# Patient Record
Sex: Female | Born: 1970 | Race: Black or African American | Hispanic: No | Marital: Married | Smoking: Never smoker
Health system: Southern US, Community
[De-identification: ages and names within clinical notes are randomized; demographics above are authoritative.]

## PROBLEM LIST (undated history)

## (undated) DIAGNOSIS — E669 Obesity, unspecified: Secondary | ICD-10-CM

## (undated) DIAGNOSIS — E785 Hyperlipidemia, unspecified: Secondary | ICD-10-CM

## (undated) HISTORY — DX: Hyperlipidemia, unspecified: E78.5

## (undated) HISTORY — PX: NO PAST SURGERIES: SHX2092

## (undated) HISTORY — DX: Obesity, unspecified: E66.9

---

## 1999-08-19 ENCOUNTER — Other Ambulatory Visit: Admission: RE | Admit: 1999-08-19 | Discharge: 1999-08-19 | Payer: Self-pay | Admitting: *Deleted

## 2000-02-29 ENCOUNTER — Other Ambulatory Visit: Admission: RE | Admit: 2000-02-29 | Discharge: 2000-02-29 | Payer: Self-pay | Admitting: Obstetrics and Gynecology

## 2000-05-09 ENCOUNTER — Ambulatory Visit (HOSPITAL_COMMUNITY): Admission: RE | Admit: 2000-05-09 | Discharge: 2000-05-09 | Payer: Self-pay | Admitting: *Deleted

## 2000-05-09 ENCOUNTER — Encounter: Payer: Self-pay | Admitting: *Deleted

## 2000-07-31 ENCOUNTER — Inpatient Hospital Stay (HOSPITAL_COMMUNITY): Admission: AD | Admit: 2000-07-31 | Discharge: 2000-07-31 | Payer: Self-pay | Admitting: Obstetrics and Gynecology

## 2000-10-02 ENCOUNTER — Encounter (INDEPENDENT_AMBULATORY_CARE_PROVIDER_SITE_OTHER): Payer: Self-pay | Admitting: Specialist

## 2000-10-02 ENCOUNTER — Inpatient Hospital Stay (HOSPITAL_COMMUNITY): Admission: AD | Admit: 2000-10-02 | Discharge: 2000-10-05 | Payer: Self-pay | Admitting: Obstetrics and Gynecology

## 2001-01-30 ENCOUNTER — Other Ambulatory Visit: Admission: RE | Admit: 2001-01-30 | Discharge: 2001-01-30 | Payer: Self-pay | Admitting: Obstetrics and Gynecology

## 2001-04-10 ENCOUNTER — Encounter: Payer: Self-pay | Admitting: Obstetrics and Gynecology

## 2001-04-10 ENCOUNTER — Ambulatory Visit (HOSPITAL_COMMUNITY): Admission: RE | Admit: 2001-04-10 | Discharge: 2001-04-10 | Payer: Self-pay | Admitting: Obstetrics and Gynecology

## 2001-06-22 ENCOUNTER — Encounter: Admission: RE | Admit: 2001-06-22 | Discharge: 2001-09-20 | Payer: Self-pay | Admitting: Obstetrics and Gynecology

## 2001-07-30 ENCOUNTER — Inpatient Hospital Stay (HOSPITAL_COMMUNITY): Admission: AD | Admit: 2001-07-30 | Discharge: 2001-08-01 | Payer: Self-pay | Admitting: Obstetrics and Gynecology

## 2001-07-30 ENCOUNTER — Encounter (INDEPENDENT_AMBULATORY_CARE_PROVIDER_SITE_OTHER): Payer: Self-pay | Admitting: Specialist

## 2006-04-07 ENCOUNTER — Emergency Department (HOSPITAL_COMMUNITY): Admission: EM | Admit: 2006-04-07 | Discharge: 2006-04-07 | Payer: Self-pay | Admitting: Family Medicine

## 2007-04-10 ENCOUNTER — Ambulatory Visit: Payer: Self-pay | Admitting: Family Medicine

## 2007-05-03 ENCOUNTER — Other Ambulatory Visit: Admission: RE | Admit: 2007-05-03 | Discharge: 2007-05-03 | Payer: Self-pay | Admitting: Family Medicine

## 2007-05-03 ENCOUNTER — Ambulatory Visit: Payer: Self-pay | Admitting: Family Medicine

## 2007-05-08 ENCOUNTER — Ambulatory Visit: Payer: Self-pay | Admitting: Family Medicine

## 2007-05-25 ENCOUNTER — Ambulatory Visit: Payer: Self-pay | Admitting: Family Medicine

## 2007-08-23 ENCOUNTER — Ambulatory Visit: Payer: Self-pay | Admitting: Family Medicine

## 2007-10-05 ENCOUNTER — Ambulatory Visit: Payer: Self-pay | Admitting: Family Medicine

## 2008-02-22 ENCOUNTER — Ambulatory Visit: Payer: Self-pay | Admitting: Family Medicine

## 2008-10-31 ENCOUNTER — Ambulatory Visit: Payer: Self-pay | Admitting: Family Medicine

## 2009-06-16 ENCOUNTER — Ambulatory Visit: Payer: Self-pay | Admitting: Family Medicine

## 2009-07-16 ENCOUNTER — Ambulatory Visit: Payer: Self-pay | Admitting: Family Medicine

## 2010-04-01 ENCOUNTER — Ambulatory Visit: Payer: Self-pay | Admitting: Family Medicine

## 2010-04-19 ENCOUNTER — Other Ambulatory Visit: Admission: RE | Admit: 2010-04-19 | Discharge: 2010-04-19 | Payer: Self-pay | Admitting: Family Medicine

## 2010-04-19 ENCOUNTER — Ambulatory Visit: Payer: Self-pay | Admitting: Physician Assistant

## 2010-07-15 ENCOUNTER — Ambulatory Visit: Payer: Self-pay | Admitting: Physician Assistant

## 2010-10-21 ENCOUNTER — Ambulatory Visit: Payer: Self-pay | Admitting: Family Medicine

## 2011-03-28 ENCOUNTER — Encounter (INDEPENDENT_AMBULATORY_CARE_PROVIDER_SITE_OTHER): Payer: 59 | Admitting: Family Medicine

## 2011-03-28 DIAGNOSIS — I10 Essential (primary) hypertension: Secondary | ICD-10-CM

## 2011-03-28 DIAGNOSIS — Z9119 Patient's noncompliance with other medical treatment and regimen: Secondary | ICD-10-CM

## 2011-03-28 DIAGNOSIS — E669 Obesity, unspecified: Secondary | ICD-10-CM

## 2011-03-28 DIAGNOSIS — E119 Type 2 diabetes mellitus without complications: Secondary | ICD-10-CM

## 2011-03-28 DIAGNOSIS — Z79899 Other long term (current) drug therapy: Secondary | ICD-10-CM

## 2011-03-28 DIAGNOSIS — Z91199 Patient's noncompliance with other medical treatment and regimen due to unspecified reason: Secondary | ICD-10-CM

## 2011-05-13 NOTE — Discharge Summary (Signed)
[facility]  Patient:    Heather Sherman, Heather Sherman                   MRN: [id number] Adm. Date:  [id number] Disc. Date: [id number] Attending:  Shaune Spittle Dictator:   Miguel Dibble, C.N.M.                           Discharge Summary  DATE OF BIRTH:                20-Apr-1971  ADMISSION DIAGNOSES:          1. Intrauterine pregnancy at term in early                                  labor.                               2. Positive group beta strep.                               3. History of infertility.                               4. History of fetal tachycardia.  DISCHARGE DIAGNOSES:          1. Intrauterine pregnancy at term in early                                  labor.                               2. Positive group beta strep.                               3. History of infertility.                               4. History of fetal tachycardia.  FINDINGS:                     Delivered viable baby boy.  Apgars 7 and 9. Weight 7 pounds, 11 ounces by primary cesarean section.  Nonreassuring fetal heart tones.  Meconium seen in fluid.  Failure to progress in labor.  PROCEDURES:                   1. Internal fetal monitoring.                               2. amnioinfusion.                               3. Primary lower segment transverse cesarean                                  section.  HOSPITAL COURSE:  On October 8, 40 year old Heather Sherman was admitted at 39-4/7 weeks who complained of constant lower abdominal pain.  Fetal heart tones were in the 160s to 170s with acceleration to the 180s with mild variable deceleration.  Her cervix was 3 cm, completely effaced, vertex at -1 to -2.  She was having contractions every two minutes and was admitted for early labor management.  Her cervix progressed from 3 cm to 4 cm in the hour of observation and return to the admissions unit.  During her labor by 4:30 a.m., she had recurring  fetal heart tone deceleration with late components. Her cervix remained 4 cm, 90% effaced, vertex at -2.  An amniotomy was performed with meconium-stained fluid.  Fetal scalp electrode and IUPC were placed and amnioinfusion was initiated.  Contractions continued with approximately 180 [place] units for 10 minutes.  By 5:30 a.m., her cervix remained at 4 cm.  Subtle late decelerations were occurring at that time. Risks and benefits of a cesarean section were discussed with the patient.  The decision was made to proceed with a cesarean section.  She delivered a viable baby boy, Heather Sherman, weighing 7 pounds, 11 ounces, Apgars 7 and 9.  On postoperative day #1, October 9, Heather Sherman was recovering satisfactorily.  She was undecided as to contraception with bottle feeding. The baby was in the neonatal intensive care unit due to meconium aspiration syndrome and probable pneumonia.  Her fundus was firm, lochia thick, scant, incision clean, dry, and intact.  Extremities slightly edematous, within normal limits.  On postoperative day #2, her temperature and vital signs were stable, incision clean, dry, and intact.  Baby continued in the NICU.  Patient was passing flatus, tolerating her diet well and coping well with her hospitalization and her infants status.  On postoperative day #3, October 11, she had a temperature spike of 102 degrees at 8 a.m., her only elevated temperature during her hospital stay.  By 12:15 p.m., her temperature decreased to 98.7, and she remained afebrile until 8:30 p.m.  Her blood pressure, however, began to rise and went to 130s to 150s over 80s to 90s. They had been lower than this during her pregnancy.  She denied any epigastric pain or ______ symptoms.  Her breasts were engorged and full.  Lungs were clear.  Heart regular rate and rhythm.  Abdomen soft, nontender.  She is passing flatus with positive bowel sounds.  It was thought that since she was voiding well  and her urinalysis was negative, it was thought that her elevated temperature was due to breast engorgement.  Her CBC was normal.  Hemoglobin 10.1, a decrease from 10.6 the day before, hematocrit 28.5, white count 11.4, increased from 8.6 the day before, platelets 296, neutrophils slightly elevated at 79.  Catheter urine specimen was negative for infection.  She has no signs of phlebitis.  Her extremities were nonedematous with negative Homans and DTRs were +1 with negative clonus.  She remained afebrile with slightly elevated blood pressures in the 140s to 150s over 90s by 8:30 p.m.  She was discharged home in stable condition after deeming to receive the full benefit of her hospitalization.  DISCHARGE INSTRUCTIONS:  Follow up in one week at [facility] for blood pressure check and in six weeks for six-week postpartum check. Discharge instructions per [facility] booklet.  DISCHARGE MEDICATIONS:  Tylox, Motrin, prenatal vitamins.  The patient will decide upon contraception at her six-week postpartum visit.  DISPOSITION:  Her staples were removed  and Steri-Strips applied.  She is discharged home in stable condition. DD:  10/05/00 TD:  10/08/00 Job: [id number]

## 2011-05-13 NOTE — Op Note (Signed)
[facility]  Patient:    Heather Sherman, Heather Sherman                   MRN: [id number] Proc. Date: 07/30/01 Adm. Date:  [id number] Attending:  Dierdre Forth Pearline                           Operative Report  PREOPERATIVE DIAGNOSES:       1. Intrauterine pregnancy at [redacted] weeks                                  gestation.                               2. Gestational diabetes.                               3. Prior cesarean section with desire for repeat                                  cesarean section.                               4. Desire for surgical sterilization.  POSTOPERATIVE DIAGNOSES:      1. Intrauterine pregnancy at [redacted] weeks                                  gestation.                               2. Gestational diabetes.                               3. Prior cesarean section with desire for repeat                                  cesarean section.                               4. Desire for surgical sterilization.  OPERATIONS:                   1. Repeat low transverse cesarean section.                               2. Bilateral tubal sterilization.  SURGEON:                      Vanessa P. Pennie Rushing, M.D.  FIRST ASSISTANT:              Nigel Bridgeman, C.N.M.  ANESTHESIA:                   Spinal.  ESTIMATED BLOOD LOSS:         Less than 750 cc.  COMPLICATIONS:  None.  FINDINGS:                     The patient was delivered of a female infant, whose name is [name], weighing 7 lb 2 oz with Apgars of 8 and 9 at one and five minutes respectively.  The uterus, tubes and ovaries were normal for the gravid state.  DESCRIPTION OF PROCEDURE:     The patient was taken to the operating room after appropriate identification and placed on the operating table.  After placement of a spinal anesthetic, she was placed in the supine position w8ith a left lateral tilt.  The abdomen and perineum were prepped with multiple layers of Betadine.   A Foley catheter was inserted into the bladder and connected to straight drainage.  The abdomen was draped as a sterile field. After the assurance of adequate anesthesia, a transverse incision was made in the abdomen and the abdomen opened in layers.  The peritoneum was entered and the bladder blade placed.  The uterus was incised approximately 1 cm above the uterovesical fold and the incision taken laterally on either side bluntly. The amniotic sac was ruptured and the fluid was clear.  The infant was delivered from the occiput transverse position and, after having the nares and pharynx suctioned and the cord clamped and cut, was handed off to the awaiting pediatricians.  The appropriate cord blood was drawn and the placenta noted to have separated from the uterus and was removed from the operative field.  The cervix was then dilated with a sponge stick and the uterine incision closed with a running interlocking suture of 0 Vicryl.  An imbricating suture of 0 Vicryl was placed.  Hemostasis was noted to be adequate.  The left fallopian tube was identified, followed to its fimbriated end and grasped at the isthmic portion.  A suture of 2-0 chromic was placed through the mesosalpinx and tied fore and aft on the knuckle of tube.  A second ligature was placed proximal to that and the intervening knuckle of tube excised.  The cut ends were cauterized.  A similar procedure was carried out on the right side.  Copious irrigation was carried out and hemostasis noted to be adequate. The abdominal peritoneum was closed with a running suture of 2-0 Vicryl.  The rectus muscles were reapproximated in the midline with a figure-of-eight suture of 2-0 Vicryl.  The rectus muscles were irrigated and noted to be hemostatic.  The rectus fascia was closed with a running suture of 0 Vicryl, then reinforced on either side of midline with figure-of-eight sutures of 0 Vicryl.  The subcutaneous tissue was irrigated  and made hemostatic with Bovie cautery. Skin staples were applied to the skin incision.  A sterile dressing was applied and the patient was taken from the operating room to the recovery room in satisfactory condition, having tolerated the procedure well, with sponge and instrument counts correct.  The infant went to the full-term nursery. DD:  07/30/01 TD:  07/31/01 Job: [id number]

## 2011-05-13 NOTE — H&P (Signed)
[facility]  Patient:    Heather Sherman, Heather Sherman                   MRN: [id number] Adm. Date:  [id number] Disc. Date: [id number] Attending:  Shaune Spittle Dictator:   Wynelle Bourgeois, P.A.                         History and Physical  HISTORY OF PRESENT ILLNESS:   Heather Sherman is a 40 year old G2, P0 at 39-4/7ths weeks, who presents with complaints of constant lower abdominal pain since yesterday.  She denies any leaking or bleeding, and reports positive fetal movement.  The pregnancy has been followed at [facility] and it has been remarkable for: 1. Abnormal Pap smear in 1990.  2. History of infertility.  3. First trimester spotting.  4. Positive GBS in the first trimester.  5. History of fetal tachycardia.  PRENATAL LABORATORY:          Hemoglobin 11.8, hematocrit 35.1, platelets 329. Blood type B positive, antibody screen negative.  Sickle cell trait negative. RPR nonreactive.  Rubella immune.  HBsAg negative.  HIV nonreactive.  Pectus within normal limits.  Gonorrhea negative.  Chlamydia negative.  AFP within normal limits.  Glucose challenge within normal limits.  Group B strep positive.  MEDICAL HISTORY:              Remarkable for an abnormal Pap smear in 1990 and within normal limits afterwards.  She also has a history of a single urinary tract infection in 1998 and first trimester spotting.  She was evaluated for fetal tachycardia in September which was found to be a reactive NST at that time.  FAMILY HISTORY:               Remarkable for a maternal grandmother and maternal uncle with heart attacks; her father with non-insulin-dependent diabetes and also paternal aunt, maternal aunt and maternal grandmother with non-insulin-dependent diabetes; a sister and maternal aunt with thyroid dysfunction; maternal uncle with a stroke; paternal aunt with breast cancer.  GENETIC HISTORY:              Remarkable for father of the babys  uncle and patients niece with mental retardation.  SOCIAL HISTORY:               Remarkable for the patient is married to Charlies Silvers who is involved and supportive.  She is of the [employer].  She works as a Government social research officer.  She denies any alcohol, tobacco or drug use.  PHYSICAL EXAMINATION:  VITAL SIGNS:                  Stable with a temperature 97.5, pulse 95, respirations 18, blood pressure 142/78.  HEENT:                        Within normal limits.  NECK:                         Thyroid normal, not enlarged.  HEART:                        Regular rate and rhythm.  CHEST:                        Clear to auscultation bilaterally.  ABDOMEN:  Gravid, 40 cm, vertex to [place].  Electronic fetal monitoring denotes a baseline fetal heart rate of 160s with accelerations to 180s and intermittent mild variable decelerations to 140s and 150s.  Urine contractions are every two minutes and the fetus is very active.  PELVIC:                       Cervical exam is 3 cm, complete, -1 to -2 on admission and one hour later was found to be 4 cm, completely dilated, -1 station with a vertex presentation.  EXTREMITIES:                  Within normal limits.  ASSESSMENT:                   1. Early labor at 39-4/7ths weeks.                               2. Group B Streptococcus positive.                               3. Variable decelerations, mild.                               4. Fetal tachycardia.  PLAN: 1. Consult to Dr. Pennie Rushing. 2. Admit to birthing suite. 3. IV hydration. 4. Observe for labor progress. DD:  10/02/00 TD:  10/02/00 Job: [id number]

## 2011-05-13 NOTE — Op Note (Signed)
[facility]  Patient:    Heather Sherman, Heather Sherman                   MRN: [id number] Proc. Date: 10/02/00 Adm. Date:  [id number] Attending:  Dierdre Forth Pearline                           Operative Report  PREOPERATIVE DIAGNOSES:       Intrauterine pregnancy at term, meconium stained amniotic fluid, nonreassuring fetal heart rate tracing, failure to progress in labor.  POSTOPERATIVE DIAGNOSES:      Intrauterine pregnancy at term, meconium stained amniotic fluid, nonreassuring fetal heart rate tracing, failure to progress in labor.  OPERATION:                    Primary low transverse cesarean section.  ANESTHESIA:                   Spinal.  ESTIMATED BLOOD LOSS:         750 cc.  COMPLICATIONS:                None.  FINDINGS:                     The uterus, tubes, and ovaries were normal for the gravid state.  The patient was delivered of a female infant whose name is [name] Gandy weighing 7 pounds 11 ounces with Apgars of 7 and 9 at one and five minutes respectively.  PROCEDURE:                    The patient was taken to the operating room after appropriate identification and placed on the operating table.  After the placement of a spinal anesthetic she was placed in the supine position with a left lateral tilt.  The abdomen and perineum were prepped with multiple layers of Betadine.  A Foley catheter was inserted into the bladder under sterile conditions and connected to straight drainage.  The abdomen was draped as a sterile field.  After the assurance of adequate anesthesia a transverse incision was made in the abdomen and the abdomen opened in layers.  The peritoneum was entered and the visceroperitoneum overlying the uterus incised and that incision taken laterally on either side.  The bladder flap was bluntly developed and the bladder blade placed.  The uterus was incised in the midline and that incision taken laterally on either side bluntly.   The infant was delivered from the occiput posterior position and after having the nares and pharynx suctioned with ______ catheter the shoulders and body were delivered and the cord clamped and cut and the infant handed off to the awaiting pediatricians.  The appropriate cord blood was drawn and the placenta was noted to have spontaneously separated from the uterus and was removed with gentle traction.  The uterine cavity was cleared of products of conception with moist laparotomy pads.  The uterine incision was then closed with a running interlocking suture of 0 Vicryl.  An embrocating suture of 0 Vicryl was then placed.  Hemostasis was noted to be adequate.  A single figure-of-eight suture repaired the bladder flap.  Copious irrigation was carried out and the abdominal peritoneum closed with a running suture of 2-0 Vicryl after assuring adequate hemostasis.  The rectus muscles were reapproximated in the midline with figure-of-eight sutures of 2-0 Vicryl.  The rectus muscles were noted to  be hemostatic and irrigated.  The rectus fascia was closed with a running suture of 0 Vicryl, then reinforced on either side of midline with a figure-of-eight suture of 0 Vicryl.  The subcutaneous tissue was made hemostatic with Bovie cautery and irrigated.  The skin incision was closed with skin staples.  A sterile dressing was applied and the patient taken from the operating room to the recovery room in satisfactory condition having tolerated the procedure well with sponge and instrument counts correct. D:  10/02/00 TD:  10/02/00 Job: [id number]

## 2011-05-13 NOTE — Discharge Summary (Signed)
[facility]  Patient:    Heather Sherman, Heather Sherman                   MRN: [id number] Adm. Date:  [id number] Disc. Date: [id number] Attending:  Shaune Spittle Dictator:   Mack Guise, C.N.M.                           Discharge Summary  ADMITTING DIAGNOSES:          Intrauterine pregnancy at 38 weeks, repeat cesarean section, desires sterilization.  DISCHARGE DIAGNOSES:          Intrauterine pregnancy at 38 weeks, repeat cesarean section, desires sterilization.  PROCEDURE:                    Repeat low transverse cesarean delivery and bilateral tubal sterilization.  HISTORY:                      [name] is a 40 year old gravida 2, para 1-0-0-1 at 81 weeks who presented for repeat cesarean delivery which was done by Dr. Dierdre Forth with the birth of a 7 pound 2 ounce female infant named [name] with Apgar scores of 8 at one minute, 9 at five minutes.  [name] has done well in the postoperative period.  Her hemoglobin was 10.1 on the first postoperative day and on this her second postoperative day she requests early discharge.  She is judged to be in satisfactory condition for discharge.  DISCHARGE INSTRUCTIONS:       Per [facility] handout.  DISCHARGE MEDICATIONS:        1. Motrin 600 mg p.o. q.6h.                               2. Tylox one to two p.o. q.3-4h.  FOLLOW-UP:                    Patient will return to the office of [facility] in one to two days to remove her staples and postoperative followup will be at six weeks. DD:  08/01/01 TD:  08/01/01 Job: [id number]

## 2011-08-16 ENCOUNTER — Encounter: Payer: Self-pay | Admitting: Family Medicine

## 2011-08-19 ENCOUNTER — Encounter: Payer: Self-pay | Admitting: Family Medicine

## 2011-08-19 ENCOUNTER — Ambulatory Visit (INDEPENDENT_AMBULATORY_CARE_PROVIDER_SITE_OTHER): Payer: 59 | Admitting: Family Medicine

## 2011-08-19 DIAGNOSIS — Z79899 Other long term (current) drug therapy: Secondary | ICD-10-CM

## 2011-08-19 DIAGNOSIS — E785 Hyperlipidemia, unspecified: Secondary | ICD-10-CM

## 2011-08-19 DIAGNOSIS — Z0000 Encounter for general adult medical examination without abnormal findings: Secondary | ICD-10-CM

## 2011-08-19 DIAGNOSIS — Z23 Encounter for immunization: Secondary | ICD-10-CM

## 2011-08-19 DIAGNOSIS — N6089 Other benign mammary dysplasias of unspecified breast: Secondary | ICD-10-CM

## 2011-08-19 DIAGNOSIS — E119 Type 2 diabetes mellitus without complications: Secondary | ICD-10-CM

## 2011-08-19 LAB — COMPREHENSIVE METABOLIC PANEL
BUN: 7 mg/dL (ref 6–23)
CO2: 24 mEq/L (ref 19–32)
Calcium: 9.6 mg/dL (ref 8.4–10.5)
Chloride: 102 mEq/L (ref 96–112)
Creat: 0.61 mg/dL (ref 0.50–1.10)
Glucose, Bld: 127 mg/dL — ABNORMAL HIGH (ref 70–99)
Total Bilirubin: 0.3 mg/dL (ref 0.3–1.2)

## 2011-08-19 LAB — CBC WITH DIFFERENTIAL/PLATELET
Eosinophils Absolute: 0.1 10*3/uL (ref 0.0–0.7)
Eosinophils Relative: 2 % (ref 0–5)
HCT: 33.9 % — ABNORMAL LOW (ref 36.0–46.0)
Lymphocytes Relative: 30 % (ref 12–46)
Lymphs Abs: 2 10*3/uL (ref 0.7–4.0)
MCH: 24.3 pg — ABNORMAL LOW (ref 26.0–34.0)
MCV: 75.7 fL — ABNORMAL LOW (ref 78.0–100.0)
Monocytes Absolute: 0.4 10*3/uL (ref 0.1–1.0)
Platelets: 337 10*3/uL (ref 150–400)
RBC: 4.48 MIL/uL (ref 3.87–5.11)

## 2011-08-19 LAB — LIPID PANEL
Cholesterol: 100 mg/dL (ref 0–200)
HDL: 34 mg/dL — ABNORMAL LOW (ref >39)
Total CHOL/HDL Ratio: 2.9 Ratio
Triglycerides: 60 mg/dL (ref <150)
VLDL: 12 mg/dL (ref 0–40)

## 2011-08-19 NOTE — Progress Notes (Signed)
  Subjective:    Patient ID: Heather Sherman, female    DOB: August 03, 1971, 40 y.o.   MRN: [id number]  HPI She is here for a diabetes recheck. She continues on medications listed in the chart. She states her blood sugars run between 140 and 230. She has not continued to increase her Levemir stating she did not remember the recommendations. Her smoking and drinking were reviewed. She does not exercise with any regularity. At the end of the encounter she showed a lesion on her chest that she says to drain some purulent foul-smelling material. Review of Systems     Objective:   Physical Exam Alert and in no distress. Exam of the anterior chest shows a 3 cm discolored fluctuant lesion. Hemoglobin A1c 8.5.      Assessment & Plan:  Diabetes. Hypertension. Hyperlipidemia. Obesity. Infected sebaceous cyst. She is to increase her Levemir by 2 units every 2 days until her blood sugars under 120. Continue on all of her other medications. Flu shot given. The lesion was injected with Xylocaine and epinephrine. An I+ D was performed without difficulty. Some purulent material was expressed. The wound was cleaned and explored and packed with iodoform. She is to return here on Monday for packing removal.

## 2011-08-19 NOTE — Patient Instructions (Signed)
Keep increasing your Levemir by 2 units every 2 days until your morning blood sugar is under 120

## 2011-08-22 ENCOUNTER — Other Ambulatory Visit: Payer: Self-pay

## 2011-08-22 ENCOUNTER — Encounter: Payer: Self-pay | Admitting: Family Medicine

## 2011-08-22 ENCOUNTER — Ambulatory Visit (INDEPENDENT_AMBULATORY_CARE_PROVIDER_SITE_OTHER): Payer: 59 | Admitting: Family Medicine

## 2011-08-22 VITALS — BP 120/80 | HR 90 | Wt 206.0 lb

## 2011-08-22 DIAGNOSIS — D649 Anemia, unspecified: Secondary | ICD-10-CM

## 2011-08-22 DIAGNOSIS — L02219 Cutaneous abscess of trunk, unspecified: Secondary | ICD-10-CM

## 2011-08-22 DIAGNOSIS — L02213 Cutaneous abscess of chest wall: Secondary | ICD-10-CM

## 2011-08-22 MED ORDER — LISINOPRIL 20 MG PO TABS: 20.0000 mg | ORAL_TABLET | Freq: Every day | ORAL | Status: DC

## 2011-08-22 MED ORDER — SAXAGLIPTIN HCL 2.5 MG PO TABS: 5.0000 mg | ORAL_TABLET | Freq: Every day | ORAL | Status: DC

## 2011-08-22 MED ORDER — INSULIN DETEMIR 100 UNIT/ML ~~LOC~~ SOLN: 30.0000 [IU] | Freq: Two times a day (BID) | SUBCUTANEOUS | Status: DC

## 2011-08-22 MED ORDER — METFORMIN HCL 1000 MG PO TABS: 1000.0000 mg | ORAL_TABLET | Freq: Two times a day (BID) | ORAL | Status: DC

## 2011-08-22 MED ORDER — SIMVASTATIN 40 MG PO TABS: 40.0000 mg | ORAL_TABLET | Freq: Every day | ORAL | Status: DC

## 2011-08-22 NOTE — Progress Notes (Signed)
  Subjective:    Patient ID: Heather Sherman, female    DOB: [birth year], 40 y.o.   MRN: [id number]  HPI She is here for recheck on recent I and D. lesion is doing well. She does have a history of anemia that she dates back to the delivery of her two children. She has not had a workup on this. Recent blood work did show a low MCV.  Review of Systems     Objective:   Physical Exam The wound seems to be healing nicely. The packing was removed.       Assessment & Plan:  Chest wall abscess. Anemia. Continue to irrigate the chest wall until it heals. I will check iron studies on her.

## 2011-08-23 ENCOUNTER — Telehealth: Payer: Self-pay

## 2011-08-23 LAB — IRON AND TIBC: %SAT: 6 % — ABNORMAL LOW (ref 20–55)

## 2011-08-23 NOTE — Telephone Encounter (Signed)
Left message for pt to call me back 

## 2011-08-24 ENCOUNTER — Telehealth: Payer: Self-pay

## 2011-08-24 NOTE — Telephone Encounter (Signed)
Left message for pt that iron low double up on iron if having problem with iron please call and we can try and switch her to a different iron

## 2012-02-22 ENCOUNTER — Other Ambulatory Visit: Payer: Self-pay

## 2012-02-22 ENCOUNTER — Ambulatory Visit (INDEPENDENT_AMBULATORY_CARE_PROVIDER_SITE_OTHER): Payer: 59 | Admitting: Family Medicine

## 2012-02-22 DIAGNOSIS — I10 Essential (primary) hypertension: Secondary | ICD-10-CM

## 2012-02-22 DIAGNOSIS — E118 Type 2 diabetes mellitus with unspecified complications: Secondary | ICD-10-CM | POA: Insufficient documentation

## 2012-02-22 DIAGNOSIS — D649 Anemia, unspecified: Secondary | ICD-10-CM

## 2012-02-22 DIAGNOSIS — E785 Hyperlipidemia, unspecified: Secondary | ICD-10-CM

## 2012-02-22 DIAGNOSIS — E669 Obesity, unspecified: Secondary | ICD-10-CM | POA: Insufficient documentation

## 2012-02-22 DIAGNOSIS — E1159 Type 2 diabetes mellitus with other circulatory complications: Secondary | ICD-10-CM

## 2012-02-22 DIAGNOSIS — Z79899 Other long term (current) drug therapy: Secondary | ICD-10-CM

## 2012-02-22 DIAGNOSIS — E119 Type 2 diabetes mellitus without complications: Secondary | ICD-10-CM

## 2012-02-22 DIAGNOSIS — E1169 Type 2 diabetes mellitus with other specified complication: Secondary | ICD-10-CM | POA: Insufficient documentation

## 2012-02-22 DIAGNOSIS — I152 Hypertension secondary to endocrine disorders: Secondary | ICD-10-CM | POA: Insufficient documentation

## 2012-02-22 LAB — POCT GLYCOSYLATED HEMOGLOBIN (HGB A1C): Hemoglobin A1C: 8.3

## 2012-02-22 MED ORDER — INSULIN PEN NEEDLE 30G X 8 MM MISC: Status: DC

## 2012-02-22 MED ORDER — GLUCOSE BLOOD VI STRP: ORAL_STRIP | Status: DC

## 2012-02-22 NOTE — Progress Notes (Signed)
  Subjective:    Patient ID: Heather Sherman, female    DOB: March 17, 1971, 41 y.o.   MRN: [id number]  HPI She is here for a recheck. She continues on medications listed in the chart. She does occasionally check her blood sugars. She does occasionally change the dosing of her insulin but states that it rarely goes below 130. She does check her feet regularly. Exercises minimal. She will schedule herself for an eye exam in April. She does not smoke or drink. Review of her record indicates previous history of anemia. She is taking iron supplements.   Review of Systems     Objective:   Physical Exam Alert and in no distress. Hemoglobin A1c is 8.3.       Assessment & Plan:   1. Diabetes mellitus  POCT HgB A1C, Amb ref to Medical Nutrition Therapy-MNT  2. Anemia  CBC with Differential  3. Obesity (BMI 30-39.9)    4. Hyperlipidemia LDL goal < 70    5. Hypertension associated with diabetes    6. Encounter for long-term (current) use of other medications     over half an orifice discussing her general medical care. Recommend she increase her insulin by 2 units every 2 days to get her morning blood sugar under 120. She'll continue on her present medications. Check here in 4 months

## 2012-02-22 NOTE — Patient Instructions (Signed)
Increase your insulin by 2 units every 2 days until your morning blood sugar is under 120 

## 2012-02-22 NOTE — Telephone Encounter (Signed)
ORDERED STRIPS AND NEEDLES

## 2012-02-23 ENCOUNTER — Telehealth: Payer: Self-pay | Admitting: Family Medicine

## 2012-02-23 LAB — CBC WITH DIFFERENTIAL/PLATELET
Basophils Absolute: 0 10*3/uL (ref 0.0–0.1)
HCT: 36.9 % (ref 36.0–46.0)
Hemoglobin: 12 g/dL (ref 12.0–15.0)
Lymphocytes Relative: 39 % (ref 12–46)
Monocytes Absolute: 0.4 10*3/uL (ref 0.1–1.0)
Monocytes Relative: 7 % (ref 3–12)
Neutro Abs: 2.9 10*3/uL (ref 1.7–7.7)
Neutrophils Relative %: 51 % (ref 43–77)
RDW: 13.7 % (ref 11.5–15.5)
WBC: 5.8 10*3/uL (ref 4.0–10.5)

## 2012-02-23 MED ORDER — METFORMIN HCL 1000 MG PO TABS: 1000.0000 mg | ORAL_TABLET | Freq: Two times a day (BID) | ORAL | Status: DC

## 2012-02-23 MED ORDER — SAXAGLIPTIN HCL 5 MG PO TABS: 5.0000 mg | ORAL_TABLET | Freq: Every day | ORAL | Status: DC

## 2012-02-23 MED ORDER — SIMVASTATIN 40 MG PO TABS: 40.0000 mg | ORAL_TABLET | Freq: Every day | ORAL | Status: DC

## 2012-02-23 MED ORDER — LISINOPRIL 20 MG PO TABS: 20.0000 mg | ORAL_TABLET | Freq: Every day | ORAL | Status: DC

## 2012-02-23 MED ORDER — INSULIN DETEMIR 100 UNIT/ML ~~LOC~~ SOLN: 30.0000 [IU] | Freq: Two times a day (BID) | SUBCUTANEOUS | Status: DC

## 2012-02-23 NOTE — Telephone Encounter (Signed)
meds order  

## 2012-02-23 NOTE — Telephone Encounter (Signed)
Pt called and said only 2 of her meds were called in yesterday and she needed all of them.  I went through the list of meds in Epic and that is the exact ones she needs sent to [facility] on [address].

## 2012-02-23 NOTE — Progress Notes (Signed)
Quick Note:  The blood work is normal ______ 

## 2012-02-23 NOTE — Telephone Encounter (Signed)
Meds ordered

## 2012-03-29 ENCOUNTER — Encounter: Payer: Self-pay | Admitting: *Deleted

## 2012-03-29 ENCOUNTER — Encounter: Payer: 59 | Attending: Family Medicine | Admitting: *Deleted

## 2012-03-29 DIAGNOSIS — E669 Obesity, unspecified: Secondary | ICD-10-CM

## 2012-03-29 DIAGNOSIS — E1159 Type 2 diabetes mellitus with other circulatory complications: Secondary | ICD-10-CM

## 2012-03-29 DIAGNOSIS — I10 Essential (primary) hypertension: Secondary | ICD-10-CM | POA: Insufficient documentation

## 2012-03-29 DIAGNOSIS — Z713 Dietary counseling and surveillance: Secondary | ICD-10-CM | POA: Insufficient documentation

## 2012-03-29 DIAGNOSIS — E119 Type 2 diabetes mellitus without complications: Secondary | ICD-10-CM | POA: Insufficient documentation

## 2012-03-29 NOTE — Progress Notes (Signed)
  Medical Nutrition Therapy:  Appt start time: 1615 end time:  1715.   Assessment:  Primary concerns today: Patient here for diabetes education and assistance with weight loss. 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 She is married and has 2 boys ages 31 and 81 at home. Her hobbies are watching movies and T.V shows.  She prepares frozen convenience foods often at home because she cooks all day at work. She takes Levemir insulin but states she takes different doses each day depending on what her BG is.  MEDICATIONS: See List; diabetes medication is Levemir   DIETARY INTAKE:  Usual eating pattern includes 3 meals and 1-3 snacks per day.  Everyday foods include fair variety of most food groups.  Avoided foods include none stated.    24-hr recall:  B ( AM): bacon x 3, eggs x 1, 1 slice toast with jelly occasionally, 2% milk,  Snk ( AM): occasionally an egg roll on days off  L ( PM): varies; chicken and a roll,  Snk ( PM): chips  D ( PM): self cooks; Building services engineer frozen, diet soda Snk ( PM): apple occasionally Beverages: water, diet soda, milk,   Usual physical activity: on feet at work all day, housework at home  Estimated energy needs: 1600 calories 180 g carbohydrates 120 g protein 44 g fat  Progress Towards Goal(s):  In progress.   Nutritional Diagnosis:  NI-1.5 Excessive energy intake As related to activity level.  As evidenced by BMI of 33.8%.    Intervention:  Nutrition counseling and diabetes education provided. Discussed basic physiology of diabetes and insulin action of Levemir and fast acting analog insulins. Recommended she take a consistent dose of Levemir and increase dose by 10% after 3 days of elevated FBG. Also suggested increase in activity level by walking 15 minutes or more every day, which will be needed to provide any weight loss.  Plan: Aim for 3 Carb Choices per meal (45 grams) +/- 1 either way Aim for 0-2 Carbs per  snack if hungry Read Food Labels for Total Carbohydrate of foods Continue to test BG once a day, but alternate different times occasionally Take Levemir with consistent dose for morning and evening, increase by 10% (3 units) for both doses consistently Ask Dr about adding Rapid Acting insulin so you can correct high BG more quickly and accurately.  Handouts given during visit include:  Living Well with Diabetes  Carb Counting and Food Label handouts  Meal Plan Card  Monitoring/Evaluation:  Dietary intake, exercise, reading food labels, and body weight in 4 week(s).

## 2012-03-29 NOTE — Patient Instructions (Signed)
Plan: Aim for 3 Carb Choices per meal (45 grams) +/- 1 either way Aim for 0-2 Carbs per snack if hungry Read Food Labels for Total Carbohydrate of foods Continue to test BG once a day, but alternate different times occasionally Take Levemir with consistent dose for morning and evening, increase by 10% (3 units) for both doses consistently Ask Dr about adding Rapid Acting insulin so you can correct high BG more quickly and accurately.

## 2012-05-02 ENCOUNTER — Ambulatory Visit: Payer: 59 | Admitting: *Deleted

## 2012-08-06 ENCOUNTER — Encounter: Payer: Self-pay | Admitting: Internal Medicine

## 2012-08-09 ENCOUNTER — Encounter: Payer: Self-pay | Admitting: Family Medicine

## 2012-08-09 ENCOUNTER — Ambulatory Visit (INDEPENDENT_AMBULATORY_CARE_PROVIDER_SITE_OTHER): Payer: 59 | Admitting: Family Medicine

## 2012-08-09 VITALS — BP 132/86 | HR 82 | Wt 206.0 lb

## 2012-08-09 DIAGNOSIS — E785 Hyperlipidemia, unspecified: Secondary | ICD-10-CM

## 2012-08-09 DIAGNOSIS — E1169 Type 2 diabetes mellitus with other specified complication: Secondary | ICD-10-CM

## 2012-08-09 DIAGNOSIS — I10 Essential (primary) hypertension: Secondary | ICD-10-CM

## 2012-08-09 DIAGNOSIS — Z23 Encounter for immunization: Secondary | ICD-10-CM

## 2012-08-09 DIAGNOSIS — E119 Type 2 diabetes mellitus without complications: Secondary | ICD-10-CM

## 2012-08-09 DIAGNOSIS — Z79899 Other long term (current) drug therapy: Secondary | ICD-10-CM

## 2012-08-09 DIAGNOSIS — E669 Obesity, unspecified: Secondary | ICD-10-CM

## 2012-08-09 DIAGNOSIS — D649 Anemia, unspecified: Secondary | ICD-10-CM

## 2012-08-09 DIAGNOSIS — E1159 Type 2 diabetes mellitus with other circulatory complications: Secondary | ICD-10-CM

## 2012-08-09 LAB — CBC WITH DIFFERENTIAL/PLATELET
Basophils Absolute: 0.1 10*3/uL (ref 0.0–0.1)
Basophils Relative: 1 % (ref 0–1)
Eosinophils Relative: 2 % (ref 0–5)
HCT: 32.9 % — ABNORMAL LOW (ref 36.0–46.0)
MCHC: 34 g/dL (ref 30.0–36.0)
Monocytes Absolute: 0.5 10*3/uL (ref 0.1–1.0)
Neutro Abs: 4.5 10*3/uL (ref 1.7–7.7)
Platelets: 313 10*3/uL (ref 150–400)
RDW: 13.3 % (ref 11.5–15.5)

## 2012-08-09 LAB — COMPREHENSIVE METABOLIC PANEL
AST: 13 U/L (ref 0–37)
Alkaline Phosphatase: 41 U/L (ref 39–117)
BUN: 9 mg/dL (ref 6–23)
Creat: 0.61 mg/dL (ref 0.50–1.10)
Potassium: 3.7 mEq/L (ref 3.5–5.3)

## 2012-08-09 LAB — POCT GLYCOSYLATED HEMOGLOBIN (HGB A1C): Hemoglobin A1C: 8.3

## 2012-08-09 LAB — LIPID PANEL: HDL: 34 mg/dL — ABNORMAL LOW (ref >39)

## 2012-08-09 MED ORDER — SAXAGLIPTIN HCL 5 MG PO TABS: 5.0000 mg | ORAL_TABLET | Freq: Every day | ORAL | Status: DC

## 2012-08-09 MED ORDER — GLUCOSE BLOOD VI STRP: ORAL_STRIP | Status: DC

## 2012-08-09 MED ORDER — FERROUS SULFATE 325 (65 FE) MG PO TABS: 325.0000 mg | ORAL_TABLET | Freq: Every day | ORAL | Status: DC

## 2012-08-09 MED ORDER — INSULIN PEN NEEDLE 30G X 8 MM MISC: Status: DC

## 2012-08-09 MED ORDER — SIMVASTATIN 40 MG PO TABS: 40.0000 mg | ORAL_TABLET | Freq: Every day | ORAL | Status: DC

## 2012-08-09 MED ORDER — LISINOPRIL 20 MG PO TABS: 20.0000 mg | ORAL_TABLET | Freq: Every day | ORAL | Status: DC

## 2012-08-09 MED ORDER — INSULIN DETEMIR 100 UNIT/ML ~~LOC~~ SOLN: 30.0000 [IU] | Freq: Two times a day (BID) | SUBCUTANEOUS | Status: DC

## 2012-08-09 MED ORDER — METFORMIN HCL 1000 MG PO TABS: 1000.0000 mg | ORAL_TABLET | Freq: Two times a day (BID) | ORAL | Status: DC

## 2012-08-09 NOTE — Progress Notes (Signed)
  Subjective:    Patient ID: Heather Sherman, female    DOB: 04/01/1971, 41 y.o.   MRN: [id number]  HPI She is here for recheck. She continues on her present medication dosing regimen. She states her blood sugars run in the 200 range. She has been to the nutritionist who apparently recommended possible short acting insulin. She has not seen her ophthalmologist plans to set up an appointment. She does check her feet regularly. Social history includes smoking and drinking was reviewed. Her activity level is minimal.   Review of Systems     Objective:   Physical Exam Alert and in no distress. Diabetic foot exam recorded. Hemoglobin A1c 8.3.       Assessment & Plan:   1. Diabetes mellitus  POCT glycosylated hemoglobin (Hb A1C), POCT UA - Microalbumin, metFORMIN (GLUCOPHAGE) 1000 MG tablet, saxagliptin HCl (ONGLYZA) 5 MG TABS tablet, insulin detemir (LEVEMIR FLEXPEN) 100 UNIT/ML injection, Insulin Pen Needle (NOVOFINE) 30G X 8 MM MISC, glucose blood test strip, Ambulatory referral to Endocrinology  2. Immunization due  Tdap vaccine greater than or equal to 7yo IM  3. Anemia  ferrous sulfate 325 (65 FE) MG tablet  4. Hyperlipidemia LDL goal < 70  Lipid panel, simvastatin (ZOCOR) 40 MG tablet  5. Hypertension associated with diabetes  lisinopril (PRINIVIL,ZESTRIL) 20 MG tablet  6. Obesity (BMI 30-39.9)    7. Encounter for long-term (current) use of other medications  CBC with Differential, Comprehensive metabolic panel, Lipid panel   we will set her up to see endocrinology to get on a better diabetes regimen. She can then return to my care after she is more stable on her new regimen. I will check her again in roughly 6 months to see how she's doing on her new regimen

## 2012-08-09 NOTE — Progress Notes (Signed)
PT INFORMED ABOUT TDAP GIVEN VAS. AND IMMUNIZATION

## 2012-10-28 ENCOUNTER — Encounter (HOSPITAL_COMMUNITY): Payer: Self-pay | Admitting: Neurology

## 2012-10-28 ENCOUNTER — Emergency Department (HOSPITAL_COMMUNITY)
Admission: EM | Admit: 2012-10-28 | Discharge: 2012-10-28 | Disposition: A | Discharge status: Home / Self Care | Payer: 59 | Attending: Emergency Medicine | Admitting: Emergency Medicine

## 2012-10-28 ENCOUNTER — Emergency Department (HOSPITAL_COMMUNITY): Payer: 59

## 2012-10-28 DIAGNOSIS — E119 Type 2 diabetes mellitus without complications: Secondary | ICD-10-CM | POA: Insufficient documentation

## 2012-10-28 DIAGNOSIS — Z79899 Other long term (current) drug therapy: Secondary | ICD-10-CM | POA: Insufficient documentation

## 2012-10-28 DIAGNOSIS — E669 Obesity, unspecified: Secondary | ICD-10-CM | POA: Insufficient documentation

## 2012-10-28 DIAGNOSIS — Y939 Activity, unspecified: Secondary | ICD-10-CM | POA: Insufficient documentation

## 2012-10-28 DIAGNOSIS — Y9289 Other specified places as the place of occurrence of the external cause: Secondary | ICD-10-CM | POA: Insufficient documentation

## 2012-10-28 DIAGNOSIS — E785 Hyperlipidemia, unspecified: Secondary | ICD-10-CM | POA: Insufficient documentation

## 2012-10-28 DIAGNOSIS — Z794 Long term (current) use of insulin: Secondary | ICD-10-CM | POA: Insufficient documentation

## 2012-10-28 DIAGNOSIS — S8990XA Unspecified injury of unspecified lower leg, initial encounter: Secondary | ICD-10-CM | POA: Insufficient documentation

## 2012-10-28 DIAGNOSIS — X58XXXA Exposure to other specified factors, initial encounter: Secondary | ICD-10-CM | POA: Insufficient documentation

## 2012-10-28 LAB — BASIC METABOLIC PANEL
BUN: 8 mg/dL (ref 6–23)
Calcium: 9 mg/dL (ref 8.4–10.5)
GFR calc non Af Amer: >90 mL/min (ref >90)
Glucose, Bld: 204 mg/dL — ABNORMAL HIGH (ref 70–99)

## 2012-10-28 LAB — CBC WITH DIFFERENTIAL/PLATELET
Basophils Relative: 0 % (ref 0–1)
Eosinophils Absolute: 0.1 10*3/uL (ref 0.0–0.7)
MCH: 25.8 pg — ABNORMAL LOW (ref 26.0–34.0)
MCHC: 33.1 g/dL (ref 30.0–36.0)
Monocytes Relative: 6 % (ref 3–12)
Neutrophils Relative %: 75 % (ref 43–77)
Platelets: 267 10*3/uL (ref 150–400)

## 2012-10-28 LAB — URIC ACID: Uric Acid, Serum: 3.8 mg/dL (ref 2.4–7.0)

## 2012-10-28 MED ORDER — OXYCODONE-ACETAMINOPHEN 5-325 MG PO TABS
2.0000 | ORAL_TABLET | Freq: Once | ORAL | Status: AC
  Administered 2012-10-28: 2 via ORAL
  Filled 2012-10-28: qty 2

## 2012-10-28 MED ORDER — ONDANSETRON 4 MG PO TBDP
4.0000 mg | ORAL_TABLET | Freq: Once | ORAL | Status: AC
  Administered 2012-10-28: 4 mg via ORAL
  Filled 2012-10-28: qty 1

## 2012-10-28 MED ORDER — TRAMADOL HCL 50 MG PO TABS: 50.0000 mg | ORAL_TABLET | Freq: Four times a day (QID) | ORAL | Status: DC | PRN

## 2012-10-28 NOTE — ED Notes (Signed)
PER EMS- Pt at work this morning, sudden onset of left knee/calf pain. Pt tearful, pain 10/10. Knee warm to touch, possible swelling to calf. Sensation intact. Denying any injury. BP 120/90, HR 60, RR 18.

## 2012-10-28 NOTE — ED Notes (Signed)
Paged and spoke with Ortho 

## 2012-10-28 NOTE — ED Provider Notes (Signed)
Medical screening examination/treatment/procedure(s) were conducted as a shared visit with non-physician practitioner(s) and myself.  I personally evaluated the patient during the encounter  Doug Sou, MD 10/28/12 [phone]

## 2012-10-28 NOTE — ED Provider Notes (Signed)
History     CSN: [id number]  Arrival date & time 10/28/12  6578   First MD Initiated Contact with Patient 10/28/12 [phone]      Chief Complaint  Patient presents with  . left leg pain     (Consider location/radiation/quality/duration/timing/severity/associated sxs/prior treatment) HPI   PCP: Dr. Susann Givens  While at work the patient had an acute onset of left knee pain with out obvious injury. She was standing on her leg when she acutely felt a sudden sharp pain that continued to worsen until she could no longer walk on it causing her to gently sit down on the ground. She denies falling or hitting her head. She denies feeling ill recently with fevers, chills, weight loss, lethargy. Denies a history of gout, shortness of breath, chest pains. She is not on birth control nor has she been sedentary for an extended period of time. Her knee is swollen and it hers to touch it or move it and she is unable to bear weight.   Past Medical History  Diagnosis Date  . Obesity   . Diabetes mellitus 04/2007    AODM  . Hyperlipidemia     History reviewed. No pertinent past surgical history.  No family history on file.  History  Substance Use Topics  . Smoking status: Never Smoker   . Smokeless tobacco: Never Used  . Alcohol Use: No    OB History    Grav Para Term Preterm Abortions TAB SAB Ect Mult Living                  Review of Systems  Review of Systems  Gen: no weight loss, fevers, chills, night sweats  Eyes: no discharge or drainage, no occular pain or visual changes  Nose: no epistaxis or rhinorrhea  Mouth: no dental pain, no sore throat  Neck: no neck pain  Lungs:No wheezing, coughing or hemoptysis CV: no chest pain, palpitations, dependent edema or orthopnea  Abd: no abdominal pain, nausea, vomiting  GU: no dysuria or gross hematuria  MSK:  Left knee pain and swelling  Neuro: no headache, no focal neurologic deficits  Skin: no abnormalities Psyche:  negative.   Allergies  Review of patient's allergies indicates no known allergies.  Home Medications   Current Outpatient Rx  Name  Route  Sig  Dispense  Refill  . FERROUS SULFATE 325 (65 FE) MG PO TABS   Oral   Take 1 tablet (325 mg total) by mouth daily with breakfast.   30 tablet   11   . INSULIN DETEMIR 100 UNIT/ML [place] SOLN   Subcutaneous   Inject 30 Units into the skin 2 (two) times daily.   14 mL   5   . LISINOPRIL 20 MG PO TABS   Oral   Take 1 tablet (20 mg total) by mouth daily.   30 tablet   prn   . METFORMIN HCL 1000 MG PO TABS   Oral   Take 1 tablet (1,000 mg total) by mouth 2 (two) times daily with a meal.   60 tablet   5   . SAXAGLIPTIN HCL 5 MG PO TABS   Oral   Take 1 tablet (5 mg total) by mouth daily.   30 tablet   5   . SIMVASTATIN 40 MG PO TABS   Oral   Take 1 tablet (40 mg total) by mouth at bedtime.   30 tablet   5     BP 146/91  Pulse 96  Temp 98.2 F (36.8 C) (Oral)  Resp 16  SpO2 100%  LMP 10/24/2012  Physical Exam  Nursing note and vitals reviewed. Constitutional: She appears well-developed and well-nourished. No distress.  HENT:  Head: Normocephalic and atraumatic.  Eyes: Pupils are equal, round, and reactive to light.  Neck: Normal range of motion. Neck supple.  Cardiovascular: Normal rate and regular rhythm.   Pulmonary/Chest: Effort normal.  Abdominal: Soft.  Musculoskeletal:       Left knee: She exhibits decreased range of motion and swelling. She exhibits no effusion, no ecchymosis, no deformity, no laceration and no erythema. tenderness found. Medial joint line, lateral joint line and patellar tendon tenderness noted.       Legs: Neurological: She is alert.  Skin: Skin is warm and dry.    ED Course  Procedures (including critical care time)  Labs Reviewed  BASIC METABOLIC PANEL - Abnormal; Notable for the following:    Sodium 134 (*)     Glucose, Bld 204 (*)     All other components within normal limits   CBC WITH DIFFERENTIAL - Abnormal; Notable for the following:    Hemoglobin 10.8 (*)     HCT 32.6 (*)     MCH 25.8 (*)     All other components within normal limits  URIC ACID   Dg Knee Complete 4 Views Left  10/28/2012  *RADIOLOGY REPORT*  Clinical Data: Knee pain.  LEFT KNEE - COMPLETE 4+ VIEW  Comparison: No priors.  Findings: Four views of the left knee demonstrate no acute fracture, subluxation, dislocation, joint or soft tissue abnormality.  IMPRESSION: 1.  No acute radiographic abnormality of the left knee.   Original Report Authenticated By: Trudie Reed, M.D.      1. Knee injury       MDM  Labs normal and xray normal as well.  I have spoken with Dr. Ophelia Charter who has asked that I add on a Uric Acid and start her on an antiinflammatory medication. He will follow-up with her in the office.   Dr. Ethelda Chick has seen patient as well and agrees with plan.  Pt has been advised of the symptoms that warrant their return to the ED. Patient has voiced understanding and has agreed to follow-up with the PCP or specialist.         Dorthula Matas, PA 10/28/12 [phone]

## 2012-10-28 NOTE — Progress Notes (Signed)
Orthopedic Tech Progress Note Patient Details:  Heather Sherman 1971-08-09 [id number]  Ortho Devices Type of Ortho Device: Knee Sleeve;Crutches Ortho Device/Splint Location: left knee support with crutches Ortho Device/Splint Interventions: Application   Shawnie Pons 10/28/2012, 10:22 AM

## 2012-10-28 NOTE — ED Provider Notes (Signed)
Complains of left knee pain onset gradually 6:30 AM today while walking while at work. Pain is severe Pain is worse with movement or walking. No other complaint no treatment prior to coming here. On exam patient appears uncomfortable left lower extremity mildly swollen at knee not red or warm. Tender at knee circumferentially popliteal DP and PT pulse 2+  Doug Sou, MD 10/28/12 1648

## 2012-11-12 ENCOUNTER — Other Ambulatory Visit: Payer: Self-pay

## 2012-11-12 ENCOUNTER — Telehealth: Payer: Self-pay | Admitting: Internal Medicine

## 2012-11-12 DIAGNOSIS — Z1239 Encounter for other screening for malignant neoplasm of breast: Secondary | ICD-10-CM

## 2012-11-12 NOTE — Telephone Encounter (Signed)
Set this up for her 

## 2012-11-12 NOTE — Telephone Encounter (Signed)
Pt informed she will need to find out who is in her network and call me back and I will get her scheduled

## 2012-12-04 ENCOUNTER — Ambulatory Visit (HOSPITAL_COMMUNITY)
Admission: RE | Admit: 2012-12-04 | Discharge: 2012-12-04 | Disposition: A | Discharge status: Home / Self Care | Payer: 59 | Source: Ambulatory Visit | Attending: Family Medicine | Admitting: Family Medicine

## 2012-12-04 DIAGNOSIS — Z1239 Encounter for other screening for malignant neoplasm of breast: Secondary | ICD-10-CM

## 2012-12-04 DIAGNOSIS — Z1231 Encounter for screening mammogram for malignant neoplasm of breast: Secondary | ICD-10-CM | POA: Insufficient documentation

## 2013-01-01 ENCOUNTER — Telehealth: Payer: Self-pay | Admitting: Internal Medicine

## 2013-01-01 NOTE — Telephone Encounter (Signed)
Called in novafine 30 dis. Needles mis. # 100 with prn refills

## 2013-01-24 ENCOUNTER — Other Ambulatory Visit: Payer: Self-pay | Admitting: Family Medicine

## 2013-01-24 ENCOUNTER — Telehealth: Payer: Self-pay | Admitting: Family Medicine

## 2013-01-24 DIAGNOSIS — E119 Type 2 diabetes mellitus without complications: Secondary | ICD-10-CM

## 2013-01-24 MED ORDER — INSULIN DETEMIR 100 UNIT/ML ~~LOC~~ SOLN: 30.0000 [IU] | Freq: Two times a day (BID) | SUBCUTANEOUS | Status: DC

## 2013-01-24 NOTE — Telephone Encounter (Signed)
done

## 2013-02-20 ENCOUNTER — Encounter: Payer: Self-pay | Admitting: Family Medicine

## 2013-02-20 ENCOUNTER — Ambulatory Visit (INDEPENDENT_AMBULATORY_CARE_PROVIDER_SITE_OTHER): Payer: 59 | Admitting: Family Medicine

## 2013-02-20 VITALS — BP 122/80 | HR 91 | Wt 202.0 lb

## 2013-02-20 DIAGNOSIS — E119 Type 2 diabetes mellitus without complications: Secondary | ICD-10-CM

## 2013-02-20 LAB — POCT GLYCOSYLATED HEMOGLOBIN (HGB A1C): Hemoglobin A1C: 8.3

## 2013-02-20 NOTE — Patient Instructions (Addendum)
Increase the Levemir at night to 38 units. If your sugars not down  below 120 at me know.Marland Kitchenkeep checking your sugars during the day as well and we might need to readjust the morning dose. 20 minutes of walking daily. Don't argue!cut out the sugar!

## 2013-02-20 NOTE — Progress Notes (Signed)
  Subjective:    Patient ID: Heather Sherman, female    DOB: [birth year], 42 y.o.   MRN: [id number]  HPI Since last being seen in August she was seeing Dr. Lucianne Muss. She stopped seeing him due to economic issues of cost of visiting him plus the medication. That was in November. Since then she did okay until she hit the holidays and in her blood sugars start to go up again. Prior to this she did have morning sugars on occasion below 120.  Currently she is on30 of Levemir in the morning and 34 at night with 6 units of regular insulin before dinner.she continues on other medications listed in the chart. She does stay active but is not involved in regular exercise program. Smoking and drinking were reviewed.   Review of Systems     Objective:   Physical Exam Alert and in no distress. Hemoglobin A1c is 8.3.       Assessment & Plan:  Diabetes mellitus - Plan: POCT glycosylated hemoglobin (Hb A1C)  Obesity (BMI 30-39.9)  Hyperlipidemia LDL goal < 70  Hypertension associated with diabetes  Encouraged her to get involved in an exercise program while at work. Recommended 20 minutes of physical activity on a daily basis. Also discussed diet with her and she plans to try and do a better job there. Did recommend increasing her Levemir to 38 units. She is to then call me to let me know how she is doing. Discussed that she needs to get her morning blood sugar under 120.

## 2013-02-21 ENCOUNTER — Telehealth: Payer: Self-pay | Admitting: Internal Medicine

## 2013-02-21 MED ORDER — SAXAGLIPTIN HCL 5 MG PO TABS: 5.0000 mg | ORAL_TABLET | Freq: Every day | ORAL | Status: DC

## 2013-02-21 MED ORDER — INSULIN DETEMIR 100 UNIT/ML ~~LOC~~ SOLN: SUBCUTANEOUS | Status: DC

## 2013-02-21 MED ORDER — SIMVASTATIN 40 MG PO TABS: 40.0000 mg | ORAL_TABLET | Freq: Every day | ORAL | Status: DC

## 2013-02-21 MED ORDER — LISINOPRIL 20 MG PO TABS: 20.0000 mg | ORAL_TABLET | Freq: Every day | ORAL | Status: DC

## 2013-02-21 MED ORDER — METFORMIN HCL 1000 MG PO TABS: 1000.0000 mg | ORAL_TABLET | Freq: Two times a day (BID) | ORAL | Status: DC

## 2013-02-21 NOTE — Telephone Encounter (Signed)
Sent all meds in until she comes back in June for her appt

## 2013-03-04 ENCOUNTER — Other Ambulatory Visit: Payer: Self-pay | Admitting: Family Medicine

## 2013-05-24 ENCOUNTER — Telehealth: Payer: Self-pay | Admitting: Family Medicine

## 2013-05-24 DIAGNOSIS — E119 Type 2 diabetes mellitus without complications: Secondary | ICD-10-CM

## 2013-05-24 MED ORDER — INSULIN DETEMIR 100 UNIT/ML ~~LOC~~ SOLN: SUBCUTANEOUS | Status: DC

## 2013-05-24 NOTE — Telephone Encounter (Signed)
SENT LEVIMER IN

## 2013-06-21 ENCOUNTER — Telehealth: Payer: Self-pay | Admitting: Family Medicine

## 2013-06-21 ENCOUNTER — Encounter: Payer: Self-pay | Admitting: Family Medicine

## 2013-06-21 ENCOUNTER — Ambulatory Visit (INDEPENDENT_AMBULATORY_CARE_PROVIDER_SITE_OTHER): Payer: 59 | Admitting: Family Medicine

## 2013-06-21 VITALS — BP 126/78 | HR 81 | Wt 205.0 lb

## 2013-06-21 DIAGNOSIS — D649 Anemia, unspecified: Secondary | ICD-10-CM

## 2013-06-21 DIAGNOSIS — E1169 Type 2 diabetes mellitus with other specified complication: Secondary | ICD-10-CM

## 2013-06-21 DIAGNOSIS — E119 Type 2 diabetes mellitus without complications: Secondary | ICD-10-CM

## 2013-06-21 DIAGNOSIS — I10 Essential (primary) hypertension: Secondary | ICD-10-CM

## 2013-06-21 DIAGNOSIS — E785 Hyperlipidemia, unspecified: Secondary | ICD-10-CM

## 2013-06-21 MED ORDER — INSULIN DETEMIR 100 UNIT/ML ~~LOC~~ SOLN: SUBCUTANEOUS | Status: DC

## 2013-06-21 MED ORDER — SIMVASTATIN 40 MG PO TABS: 40.0000 mg | ORAL_TABLET | Freq: Every day | ORAL | Status: DC

## 2013-06-21 MED ORDER — METFORMIN HCL 1000 MG PO TABS: 1000.0000 mg | ORAL_TABLET | Freq: Two times a day (BID) | ORAL | Status: DC

## 2013-06-21 MED ORDER — GLUCOSE BLOOD VI STRP: ORAL_STRIP | Status: DC

## 2013-06-21 MED ORDER — SAXAGLIPTIN HCL 5 MG PO TABS: 5.0000 mg | ORAL_TABLET | Freq: Every day | ORAL | Status: DC

## 2013-06-21 MED ORDER — LISINOPRIL 20 MG PO TABS: 20.0000 mg | ORAL_TABLET | Freq: Every day | ORAL | Status: DC

## 2013-06-21 MED ORDER — FERROUS SULFATE 325 (65 FE) MG PO TABS: 325.0000 mg | ORAL_TABLET | Freq: Every day | ORAL | Status: DC

## 2013-06-21 NOTE — Progress Notes (Signed)
PATIENT HOME BLOOD SUGARS ARE HIGH : 170 low: 116 No current symptoms/PROBLEMS DAILY FOOT CHECKS/CONCERNS YES AND NO LAST DM EYE EXAM 2012 DR.BREWINGTON [place] CURRENT DIET WATCHES FOOD SHE EATS EXERCISE WALK EVERY NOW AND THEN

## 2013-06-21 NOTE — Telephone Encounter (Signed)
SENT LEVEMIR ASKED FOR FLEX PEN NOT VIAL

## 2013-06-21 NOTE — Progress Notes (Signed)
  Subjective:    Heather Sherman is a 42 y.o. female who presents for follow-up of Type 2 diabetes mellitus.    Home blood sugar records: fasting range: 100  Current symptoms/problems include none and have   been unchanged. Daily foot checks, foot concerns:  Last eye exam:     Medication compliance:  fair Current diet: fair Current exercise: walking  once or twice per week Known diabetic complications: none Cardiovascular risk factors: none   The following portions of the patient's history were reviewed and updated as appropriate: allergies, current medications, past medical history, past social history and problem list.  ROS as in subjective above    Objective:    General appearence: alert, no distress, WD/WN Hemoglobin A1c is 8.3  Lab Review Lab Results  Component Value Date   HGBA1C 8.3 02/20/2013   Lab Results  Component Value Date   CHOL 118 08/09/2012   HDL 34* 08/09/2012   LDLCALC 70 08/09/2012   TRIG 68 08/09/2012   CHOLHDL 3.5 08/09/2012   No results found for this basename: [facility]     Chemistry      Component Value Date/Time   NA 134* 10/28/2012 0811   K 4.2 10/28/2012 0811   CL 99 10/28/2012 0811   CO2 25 10/28/2012 0811   BUN 8 10/28/2012 0811   CREATININE 0.56 10/28/2012 0811   CREATININE 0.61 08/09/2012 1523      Component Value Date/Time   CALCIUM 9.0 10/28/2012 0811   ALKPHOS 41 08/09/2012 1523   AST 13 08/09/2012 1523   ALT 11 08/09/2012 1523   BILITOT 0.2* 08/09/2012 1523        Chemistry      Component Value Date/Time   NA 134* 10/28/2012 0811   K 4.2 10/28/2012 0811   CL 99 10/28/2012 0811   CO2 25 10/28/2012 0811   BUN 8 10/28/2012 0811   CREATININE 0.56 10/28/2012 0811   CREATININE 0.61 08/09/2012 1523      Component Value Date/Time   CALCIUM 9.0 10/28/2012 0811   ALKPHOS 41 08/09/2012 1523   AST 13 08/09/2012 1523   ALT 11 08/09/2012 1523   BILITOT 0.2* 08/09/2012 1523       Last optometry/ophthalmology exam reviewed  from:    Assessment:   Encounter Diagnoses  Name Primary?  . Diabetes mellitus Yes  . Hypertension associated with diabetes   . Hyperlipidemia LDL goal < 70   . Anemia          Plan:    1.  Rx changes: none 2.  Education: Reviewed 'ABCs' of diabetes management (respective goals in parentheses):  A1C (<7), blood pressure (<130/80), and cholesterol (LDL <100). 3.  Compliance at present is estimated to be poor. Efforts to improve compliance (if necessary) will be directed at increased exercise. 4. Follow up: 4 months  Discussed importance for her to make diet and exercise changes. Also she is checking her blood sugar 2 hours after eating but not making any adjustments. 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 Also encouraged her to walk daily for at least 20 minutes.

## 2013-06-21 NOTE — Patient Instructions (Signed)
20 minutes of walking every day. You can also take 2 10 minute walks. Talk to the dietitian at work and let see these numbers decrease

## 2013-10-28 ENCOUNTER — Ambulatory Visit (INDEPENDENT_AMBULATORY_CARE_PROVIDER_SITE_OTHER): Payer: 59 | Admitting: Family Medicine

## 2013-10-28 ENCOUNTER — Encounter: Payer: Self-pay | Admitting: Family Medicine

## 2013-10-28 VITALS — BP 120/78 | Wt 200.0 lb

## 2013-10-28 DIAGNOSIS — Z79899 Other long term (current) drug therapy: Secondary | ICD-10-CM

## 2013-10-28 DIAGNOSIS — D649 Anemia, unspecified: Secondary | ICD-10-CM

## 2013-10-28 DIAGNOSIS — E669 Obesity, unspecified: Secondary | ICD-10-CM

## 2013-10-28 DIAGNOSIS — Z23 Encounter for immunization: Secondary | ICD-10-CM

## 2013-10-28 DIAGNOSIS — E1169 Type 2 diabetes mellitus with other specified complication: Secondary | ICD-10-CM

## 2013-10-28 DIAGNOSIS — I10 Essential (primary) hypertension: Secondary | ICD-10-CM

## 2013-10-28 DIAGNOSIS — E785 Hyperlipidemia, unspecified: Secondary | ICD-10-CM

## 2013-10-28 DIAGNOSIS — E119 Type 2 diabetes mellitus without complications: Secondary | ICD-10-CM

## 2013-10-28 DIAGNOSIS — E1159 Type 2 diabetes mellitus with other circulatory complications: Secondary | ICD-10-CM

## 2013-10-28 LAB — COMPREHENSIVE METABOLIC PANEL
ALT: 10 U/L (ref 0–35)
Albumin: 4.1 g/dL (ref 3.5–5.2)
CO2: 23 mEq/L (ref 19–32)
Calcium: 9.2 mg/dL (ref 8.4–10.5)
Chloride: 100 mEq/L (ref 96–112)
Glucose, Bld: 92 mg/dL (ref 70–99)
Potassium: 4.3 mEq/L (ref 3.5–5.3)
Sodium: 136 mEq/L (ref 135–145)
Total Bilirubin: 0.3 mg/dL (ref 0.3–1.2)
Total Protein: 7.5 g/dL (ref 6.0–8.3)

## 2013-10-28 LAB — CBC WITH DIFFERENTIAL/PLATELET
Eosinophils Absolute: 0.1 10*3/uL (ref 0.0–0.7)
Hemoglobin: 10.7 g/dL — ABNORMAL LOW (ref 12.0–15.0)
Lymphocytes Relative: 32 % (ref 12–46)
Lymphs Abs: 2.1 10*3/uL (ref 0.7–4.0)
MCH: 23.8 pg — ABNORMAL LOW (ref 26.0–34.0)
Monocytes Relative: 7 % (ref 3–12)
Neutrophils Relative %: 58 % (ref 43–77)
RBC: 4.5 MIL/uL (ref 3.87–5.11)
WBC: 6.5 10*3/uL (ref 4.0–10.5)

## 2013-10-28 LAB — POCT UA - MICROALBUMIN: Albumin/Creatinine Ratio, Urine, POC: <12.6

## 2013-10-28 LAB — LIPID PANEL
Cholesterol: 115 mg/dL (ref 0–200)
Triglycerides: 80 mg/dL (ref <150)

## 2013-10-28 NOTE — Progress Notes (Signed)
Subjective:    Heather Sherman is a 42 y.o. female who presents for follow-up of Type 2 diabetes mellitus.  She did admit that she was not taking her metformin regularly but when she did start taking it she noted the blood sugars dropping in the low 100s in the morning. She realizes that she should have been doing this all along. She was not taking it that way because someone to hold her metformin because her to end up needing dialysis. I explained that that is totally wrong. She has a previous history of slight anemia  Home blood sugar records: AVG 120  Current symptoms/ NONE Daily foot checks: Any foot concerns: NONE Last eye exam:  2012   Medication compliance: good Current diet: LOW CARB Current exercise: WALKING twice per week for 15-20 minutes Known diabetic complications: none Cardiovascular risk factors: diabetes mellitus, dyslipidemia, hypertension, obesity (BMI >= 30 kg/m2) and sedentary lifestyle   The following portions of the patient's history were reviewed and updated as appropriate: allergies, current medications, past family history, past medical history, past social history and problem list.  ROS as in subjective above    Objective:    There were no vitals taken for this visit.  There were no vitals filed for this visit.  General appearence: alert, no distress, WD/WN Neck: supple, no lymphadenopathy, no thyromegaly, no masses Heart: RRR, normal S1, S2, no murmurs Lungs: CTA bilaterally, no wheezes, rhonchi, or rales Abdomen: +bs, soft, non tender, non distended, no masses, no hepatomegaly, no splenomegaly Pulses: 2+ symmetric, upper and lower extremities, normal cap refill Ext: no edema Foot exam:  Neuro: foot monofilament exam normal   Lab Review Lab Results  Component Value Date   HGBA1C 8.3 06/21/2013   Lab Results  Component Value Date   CHOL 118 08/09/2012   HDL 34* 08/09/2012   LDLCALC 70 08/09/2012   TRIG 68 08/09/2012   CHOLHDL 3.5 08/09/2012    No results found for this basename: [facility]     Chemistry      Component Value Date/Time   NA 134* 10/28/2012 0811   K 4.2 10/28/2012 0811   CL 99 10/28/2012 0811   CO2 25 10/28/2012 0811   BUN 8 10/28/2012 0811   CREATININE 0.56 10/28/2012 0811   CREATININE 0.61 08/09/2012 1523      Component Value Date/Time   CALCIUM 9.0 10/28/2012 0811   ALKPHOS 41 08/09/2012 1523   AST 13 08/09/2012 1523   ALT 11 08/09/2012 1523   BILITOT 0.2* 08/09/2012 1523        Chemistry      Component Value Date/Time   NA 134* 10/28/2012 0811   K 4.2 10/28/2012 0811   CL 99 10/28/2012 0811   CO2 25 10/28/2012 0811   BUN 8 10/28/2012 0811   CREATININE 0.56 10/28/2012 0811   CREATININE 0.61 08/09/2012 1523      Component Value Date/Time   CALCIUM 9.0 10/28/2012 0811   ALKPHOS 41 08/09/2012 1523   AST 13 08/09/2012 1523   ALT 11 08/09/2012 1523   BILITOT 0.2* 08/09/2012 1523       Last optometry/ophthalmology exam reviewed from:    Assessment:   Encounter Diagnoses  Name Primary?  . Diabetes mellitus Yes  . Need for prophylactic vaccination and inoculation against influenza   . Obesity (BMI 30-39.9)   . Hypertension associated with diabetes   . Hyperlipidemia LDL goal < 70   . Anemia   . Encounter for long-term (current)  use of other medications          Plan:    1.  Rx changes: none 2.  Education: Reviewed 'ABCs' of diabetes management (respective goals in parentheses):  A1C (<7), blood pressure (<130/80), and cholesterol (LDL <100). 3.  Compliance at present is estimated to be good. Efforts to improve compliance (if necessary) will be directed at increased exercise. 4. Follow up: 4 months  I encouraged her to increase her physical activities to walking on a daily basis for at least 20 minutes.

## 2013-10-29 ENCOUNTER — Other Ambulatory Visit: Payer: Self-pay

## 2013-10-29 ENCOUNTER — Telehealth: Payer: Self-pay | Admitting: Family Medicine

## 2013-10-29 DIAGNOSIS — E1159 Type 2 diabetes mellitus with other circulatory complications: Secondary | ICD-10-CM

## 2013-10-29 DIAGNOSIS — E119 Type 2 diabetes mellitus without complications: Secondary | ICD-10-CM

## 2013-10-29 DIAGNOSIS — E785 Hyperlipidemia, unspecified: Secondary | ICD-10-CM

## 2013-10-29 MED ORDER — INSULIN DETEMIR 100 UNIT/ML FLEXPEN: PEN_INJECTOR | SUBCUTANEOUS | Status: DC

## 2013-10-29 MED ORDER — SAXAGLIPTIN HCL 5 MG PO TABS: 5.0000 mg | ORAL_TABLET | Freq: Every day | ORAL | Status: DC

## 2013-10-29 MED ORDER — LISINOPRIL 20 MG PO TABS: 20.0000 mg | ORAL_TABLET | Freq: Every day | ORAL | Status: DC

## 2013-10-29 MED ORDER — SIMVASTATIN 40 MG PO TABS: 40.0000 mg | ORAL_TABLET | Freq: Every day | ORAL | Status: DC

## 2013-10-29 MED ORDER — METFORMIN HCL 1000 MG PO TABS: 1000.0000 mg | ORAL_TABLET | Freq: Two times a day (BID) | ORAL | Status: DC

## 2013-10-29 NOTE — Telephone Encounter (Signed)
done

## 2013-10-29 NOTE — Telephone Encounter (Signed)
SENT FLEX PEN IN

## 2014-02-27 ENCOUNTER — Ambulatory Visit (INDEPENDENT_AMBULATORY_CARE_PROVIDER_SITE_OTHER): Payer: 59 | Admitting: Family Medicine

## 2014-02-27 ENCOUNTER — Encounter: Payer: Self-pay | Admitting: Family Medicine

## 2014-02-27 VITALS — BP 132/82 | HR 64 | Wt 197.0 lb

## 2014-02-27 DIAGNOSIS — I10 Essential (primary) hypertension: Secondary | ICD-10-CM

## 2014-02-27 DIAGNOSIS — E1169 Type 2 diabetes mellitus with other specified complication: Secondary | ICD-10-CM

## 2014-02-27 DIAGNOSIS — D649 Anemia, unspecified: Secondary | ICD-10-CM

## 2014-02-27 DIAGNOSIS — E1159 Type 2 diabetes mellitus with other circulatory complications: Secondary | ICD-10-CM

## 2014-02-27 DIAGNOSIS — E119 Type 2 diabetes mellitus without complications: Secondary | ICD-10-CM

## 2014-02-27 DIAGNOSIS — E785 Hyperlipidemia, unspecified: Secondary | ICD-10-CM

## 2014-02-27 DIAGNOSIS — E669 Obesity, unspecified: Secondary | ICD-10-CM

## 2014-02-27 LAB — POCT GLYCOSYLATED HEMOGLOBIN (HGB A1C): Hemoglobin A1C: 7.8

## 2014-02-27 MED ORDER — GLUCOSE BLOOD VI STRP: ORAL_STRIP | Status: DC

## 2014-02-27 MED ORDER — INSULIN PEN NEEDLE 30G X 8 MM MISC: 1.0000 | Status: DC | PRN

## 2014-02-27 MED ORDER — SAXAGLIPTIN HCL 5 MG PO TABS: 5.0000 mg | ORAL_TABLET | Freq: Every day | ORAL | Status: DC

## 2014-02-27 MED ORDER — SIMVASTATIN 40 MG PO TABS: 40.0000 mg | ORAL_TABLET | Freq: Every day | ORAL | Status: DC

## 2014-02-27 MED ORDER — METFORMIN HCL 1000 MG PO TABS: 1000.0000 mg | ORAL_TABLET | Freq: Two times a day (BID) | ORAL | Status: DC

## 2014-02-27 MED ORDER — INSULIN DETEMIR 100 UNIT/ML FLEXPEN: PEN_INJECTOR | SUBCUTANEOUS | Status: DC

## 2014-02-27 MED ORDER — LISINOPRIL 20 MG PO TABS: 20.0000 mg | ORAL_TABLET | Freq: Every day | ORAL | Status: DC

## 2014-02-27 NOTE — Progress Notes (Signed)
   Subjective:    Patient ID: Heather Sherman, female    DOB: 05-Mar-1971, 43 y.o.   MRN: [id number]  HPI She is here for a diabetes recheck. She does check her blood sugars and they do fluctuate and she recognizes this is related to her eating habits. Her exercise is quite minimal. Smoking and drinking were reviewed. She continues on medications listed in the chart. She does check her feet intermittently. Has not had an eye exam recently. He does have an underlying history of anemia and continues on iron supplements.   Review of Systems     Objective:   Physical Exam Alert and in no distress. Hemoglobin A1c is 8.2       Assessment & Plan:  Hypertension associated with diabetes - Plan: lisinopril (PRINIVIL,ZESTRIL) 20 MG tablet  Diabetes mellitus - Plan: Insulin Detemir (LEVEMIR FLEXTOUCH) 100 UNIT/ML Pen, Insulin Pen Needle (NOVOFINE) 30G X 8 MM MISC, glucose blood (ONE TOUCH ULTRA TEST) test strip, metFORMIN (GLUCOPHAGE) 1000 MG tablet, saxagliptin HCl (ONGLYZA) 5 MG TABS tablet  Hyperlipidemia LDL goal < 70 - Plan: simvastatin (ZOCOR) 40 MG tablet  Obesity (BMI 30-39.9)  Anemia  discussed diet with her in regard to cutting back on high-calorie foods. Explained that she did not need to stop it definitely cut back. Also strongly encouraged her to get involved in an exercise program. Explained that if he does a better job we can potentially stop some of her medications. Recheck here in 4 months.

## 2014-02-27 NOTE — Addendum Note (Signed)
Addended by: Clyde Lundborg A on: 02/27/2014 02:01 PM   Modules accepted: Orders

## 2014-02-27 NOTE — Patient Instructions (Signed)
Have a snack but less of it. Start a walking program with just 10 minutes a day

## 2014-06-30 ENCOUNTER — Other Ambulatory Visit (HOSPITAL_COMMUNITY)
Admission: RE | Admit: 2014-06-30 | Discharge: 2014-06-30 | Disposition: A | Discharge status: Home / Self Care | Payer: 59 | Source: Ambulatory Visit | Attending: Family Medicine | Admitting: Family Medicine

## 2014-06-30 ENCOUNTER — Ambulatory Visit (INDEPENDENT_AMBULATORY_CARE_PROVIDER_SITE_OTHER): Payer: 59 | Admitting: Family Medicine

## 2014-06-30 ENCOUNTER — Encounter: Payer: Self-pay | Admitting: Family Medicine

## 2014-06-30 ENCOUNTER — Other Ambulatory Visit: Payer: Self-pay

## 2014-06-30 VITALS — BP 130/80 | HR 90 | Ht 66.0 in | Wt 203.0 lb

## 2014-06-30 DIAGNOSIS — E119 Type 2 diabetes mellitus without complications: Secondary | ICD-10-CM

## 2014-06-30 DIAGNOSIS — E1159 Type 2 diabetes mellitus with other circulatory complications: Secondary | ICD-10-CM

## 2014-06-30 DIAGNOSIS — D509 Iron deficiency anemia, unspecified: Secondary | ICD-10-CM

## 2014-06-30 DIAGNOSIS — I152 Hypertension secondary to endocrine disorders: Secondary | ICD-10-CM

## 2014-06-30 DIAGNOSIS — I10 Essential (primary) hypertension: Secondary | ICD-10-CM

## 2014-06-30 DIAGNOSIS — L732 Hidradenitis suppurativa: Secondary | ICD-10-CM

## 2014-06-30 DIAGNOSIS — Z01419 Encounter for gynecological examination (general) (routine) without abnormal findings: Secondary | ICD-10-CM | POA: Insufficient documentation

## 2014-06-30 DIAGNOSIS — D649 Anemia, unspecified: Secondary | ICD-10-CM

## 2014-06-30 DIAGNOSIS — Z0000 Encounter for general adult medical examination without abnormal findings: Secondary | ICD-10-CM

## 2014-06-30 DIAGNOSIS — E669 Obesity, unspecified: Secondary | ICD-10-CM

## 2014-06-30 DIAGNOSIS — E785 Hyperlipidemia, unspecified: Secondary | ICD-10-CM

## 2014-06-30 DIAGNOSIS — E1169 Type 2 diabetes mellitus with other specified complication: Secondary | ICD-10-CM

## 2014-06-30 LAB — CBC WITH DIFFERENTIAL/PLATELET
BASOS ABS: 0.1 10*3/uL (ref 0.0–0.1)
BASOS PCT: 1 % (ref 0–1)
Eosinophils Absolute: 0.1 10*3/uL (ref 0.0–0.7)
Eosinophils Relative: 2 % (ref 0–5)
HCT: 31.6 % — ABNORMAL LOW (ref 36.0–46.0)
Hemoglobin: 10.3 g/dL — ABNORMAL LOW (ref 12.0–15.0)
Lymphocytes Relative: 33 % (ref 12–46)
Lymphs Abs: 2.2 10*3/uL (ref 0.7–4.0)
MCH: 22.6 pg — ABNORMAL LOW (ref 26.0–34.0)
MCHC: 32.6 g/dL (ref 30.0–36.0)
MCV: 69.3 fL — ABNORMAL LOW (ref 78.0–100.0)
Monocytes Absolute: 0.5 10*3/uL (ref 0.1–1.0)
Monocytes Relative: 7 % (ref 3–12)
NEUTROS ABS: 3.8 10*3/uL (ref 1.7–7.7)
Neutrophils Relative %: 57 % (ref 43–77)
Platelets: 401 10*3/uL — ABNORMAL HIGH (ref 150–400)
RBC: 4.56 MIL/uL (ref 3.87–5.11)
RDW: 16.3 % — AB (ref 11.5–15.5)
WBC: 6.7 10*3/uL (ref 4.0–10.5)

## 2014-06-30 LAB — POCT URINALYSIS DIPSTICK
Bilirubin, UA: NEGATIVE
Blood, UA: NEGATIVE
Glucose, UA: NEGATIVE
KETONES UA: NEGATIVE
LEUKOCYTES UA: NEGATIVE
Nitrite, UA: NEGATIVE
PH UA: 5
Protein, UA: NEGATIVE
SPEC GRAV UA: 1.01
Urobilinogen, UA: NEGATIVE

## 2014-06-30 LAB — POCT UA - MICROALBUMIN
Albumin/Creatinine Ratio, Urine, POC: 7.7
Creatinine, POC: 114 mg/dL
Microalbumin Ur, POC: 8.8 mg/L

## 2014-06-30 LAB — POCT GLYCOSYLATED HEMOGLOBIN (HGB A1C): Hemoglobin A1C: 7.7

## 2014-06-30 MED ORDER — LISINOPRIL 20 MG PO TABS: 20.0000 mg | ORAL_TABLET | Freq: Every day | ORAL | Status: DC

## 2014-06-30 MED ORDER — METFORMIN HCL 1000 MG PO TABS: 1000.0000 mg | ORAL_TABLET | Freq: Two times a day (BID) | ORAL | Status: DC

## 2014-06-30 MED ORDER — DOXYCYCLINE HYCLATE 100 MG PO TABS: 100.0000 mg | ORAL_TABLET | Freq: Two times a day (BID) | ORAL | Status: DC

## 2014-06-30 MED ORDER — SIMVASTATIN 40 MG PO TABS: 40.0000 mg | ORAL_TABLET | Freq: Every day | ORAL | Status: DC

## 2014-06-30 MED ORDER — FERROUS SULFATE 325 (65 FE) MG PO TABS: 325.0000 mg | ORAL_TABLET | Freq: Every day | ORAL | Status: DC

## 2014-06-30 MED ORDER — SAXAGLIPTIN HCL 5 MG PO TABS: 5.0000 mg | ORAL_TABLET | Freq: Every day | ORAL | Status: DC

## 2014-06-30 MED ORDER — GLUCOSE BLOOD VI STRP: ORAL_STRIP | Status: DC

## 2014-06-30 MED ORDER — INSULIN DETEMIR 100 UNIT/ML FLEXPEN: PEN_INJECTOR | SUBCUTANEOUS | Status: DC

## 2014-06-30 MED ORDER — INSULIN PEN NEEDLE 30G X 8 MM MISC: 1.0000 | Status: DC | PRN

## 2014-06-30 NOTE — Patient Instructions (Addendum)
Work on keeping your blood sugar under 180. Check with Dr. Margarita Grizzle office on an appointment. If you have more trouble with the abscesses underarms, set up an appointment with me.

## 2014-06-30 NOTE — Telephone Encounter (Signed)
PT CALLED AND STATED SHE NEEDED REFILL ON ALL HER MEDS SO I WENT ON AND SENT THEM IN

## 2014-06-30 NOTE — Progress Notes (Signed)
Subjective:    Heather Sherman is a 43 y.o. female who presents for follow-up of Type 2 diabetes mellitus and Annual Physical Exam.    Home blood sugar records: MORNINGS 120 AFTERNOONS 250  Current symptoms/problems NONE Daily foot checks: checks daily, denies any foot ulcerations, loss of sensation, numbness and tingling.   Any foot concerns: none YES/NO Last eye exam:  DR. Ricki Miller 2013. Patient plans to return for follow up.   Medication compliance: Reports 100% medication compliance. No adverse effects including metallic taste, nausea, vomiting, decreased appetite. Current diet: Tries to eat vegetables, salads, meats, tries to avoid starchy foods but doesn't always. Current exercise: A little walking for 20 minutes about 3 times a week. Known diabetic complications: none Cardiovascular risk factors: diabetes mellitus, hypertension and obesity (BMI >= 30 kg/m2)  HTN - reports medication compliance with lisinopril, denies adverse effects including cough, angioedema. Does not check blood pressure at home. Denies vision changes, edema, heart racing, palpitations. Diet and exercise as noted above.  Hyperlipidemia - reports medication compliance with simvastatin, denies adverse effects including myalgias. Diet and exercise as noted above.  Anemia - reports poor compliance with ferrous sulfate, once a month. She admits that she gets really constipated so she does not like taking the ferrous sulfate. Denies feeling cold, fatigued, weakness. Admits some occasional tingling sensations when she's asleep. It resolves with movement and generally does not bother the patient, does not interrupt her sleep.   Skin bumps - patient reports 5 month history of intermittent bumps under her right arm pit. They are painful and drain pus and blood at times. For relief she keeps them covered with band-aids. She has previously had these types of bumps which were resolved with incision and drainage and  antibiotics in 2012.  Patient denies other questions or concerns.  The following portions of the patient's history were reviewed and updated as appropriate: allergies, current medications, past family history, past medical history, past social history, past surgical history and problem list.  ROS as in subjective above.    Objective:    BP 130/80  Pulse 90  Ht 5' 6"  (1.676 m)  Wt 203 lb (92.08 kg)  BMI 32.78 kg/m2  SpO2 98%  Filed Vitals:   06/30/14 0831  BP: 130/80  Pulse: 90    General appearence: alert, no distress, WD/WN Neck: supple, no lymphadenopathy, no thyromegaly, no masses Heart: RRR, normal S1, S2, no murmurs Lungs: CTA bilaterally, no wheezes, rhonchi, or rales Abdomen: +bs, soft, non tender, non distended, no masses, no hepatomegaly, no splenomegaly Pulses: 2+ symmetric, upper and lower extremities, normal cap refill Ext: no edema Foot exam: Skin is dry and intact. No ulcerations, bunions. Sensation is intact bilaterally. Pulses are 1+ bilaterally. Neuro: foot monofilament exam normal Otic exam shows no masses. Rectal exam is guaiac-negative.  Lab Review Lab Results  Component Value Date   HGBA1C 7.8 02/27/2014   Lab Results  Component Value Date   CHOL 115 10/28/2013   HDL 33* 10/28/2013   LDLCALC 66 10/28/2013   TRIG 80 10/28/2013   CHOLHDL 3.5 10/28/2013   No results found for this basename: Derl Barrow     Chemistry      Component Value Date/Time   NA 136 10/28/2013 0934   K 4.3 10/28/2013 0934   CL 100 10/28/2013 0934   CO2 23 10/28/2013 0934   BUN 8 10/28/2013 0934   CREATININE 0.59 10/28/2013 0934   CREATININE 0.56 10/28/2012 8882  Component Value Date/Time   CALCIUM 9.2 10/28/2013 0934   ALKPHOS 39 10/28/2013 0934   AST 12 10/28/2013 0934   ALT 10 10/28/2013 0934   BILITOT 0.3 10/28/2013 0934        Chemistry      Component Value Date/Time   NA 136 10/28/2013 0934   K 4.3 10/28/2013 0934   CL 100 10/28/2013 0934   CO2 23  10/28/2013 0934   BUN 8 10/28/2013 0934   CREATININE 0.59 10/28/2013 0934   CREATININE 0.56 10/28/2012 0811      Component Value Date/Time   CALCIUM 9.2 10/28/2013 0934   ALKPHOS 39 10/28/2013 0934   AST 12 10/28/2013 0934   ALT 10 10/28/2013 0934   BILITOT 0.3 10/28/2013 0934       Last optometry/ophthalmology exam reviewed from:    Assessment:   Encounter Diagnoses  Name Primary?  . Obesity (BMI 30-39.9)   . Hypertension associated with diabetes   . Hyperlipidemia with target LDL less than 70   . Type 2 diabetes mellitus without complication   . Iron deficiency anemia   . Routine general medical examination at a health care facility Yes  . Hidradenitis suppurativa of right axilla        Plan:    1.  Rx changes: none did add doxycycline to help with  her hidradenitis. 2.  Education: Reviewed 'ABCs' of diabetes management (respective goals in parentheses):  A1C (<7), blood pressure (<130/80), and cholesterol (LDL <100). 3.  Compliance at present is estimated to be good. Efforts to improve compliance (if necessary) will be directed at dietary modifications: Cut back on carbs. 4. Follow up: 4 months  4. She has more trouble with her hidradenitis, she will set up an appointment for further evaluation and treatment. Encouraged her to continue on her iron supplementation. CBC drawn.

## 2014-07-01 LAB — CYTOLOGY - PAP

## 2014-11-03 ENCOUNTER — Ambulatory Visit (INDEPENDENT_AMBULATORY_CARE_PROVIDER_SITE_OTHER): Payer: 59 | Admitting: Family Medicine

## 2014-11-03 ENCOUNTER — Encounter: Payer: Self-pay | Admitting: Family Medicine

## 2014-11-03 VITALS — BP 124/80 | HR 87 | Wt 200.0 lb

## 2014-11-03 DIAGNOSIS — D649 Anemia, unspecified: Secondary | ICD-10-CM

## 2014-11-03 DIAGNOSIS — D509 Iron deficiency anemia, unspecified: Secondary | ICD-10-CM

## 2014-11-03 DIAGNOSIS — E669 Obesity, unspecified: Secondary | ICD-10-CM

## 2014-11-03 DIAGNOSIS — E119 Type 2 diabetes mellitus without complications: Secondary | ICD-10-CM

## 2014-11-03 DIAGNOSIS — Z23 Encounter for immunization: Secondary | ICD-10-CM

## 2014-11-03 DIAGNOSIS — E1159 Type 2 diabetes mellitus with other circulatory complications: Secondary | ICD-10-CM

## 2014-11-03 DIAGNOSIS — I10 Essential (primary) hypertension: Secondary | ICD-10-CM

## 2014-11-03 DIAGNOSIS — E782 Mixed hyperlipidemia: Secondary | ICD-10-CM

## 2014-11-03 DIAGNOSIS — I152 Hypertension secondary to endocrine disorders: Secondary | ICD-10-CM

## 2014-11-03 DIAGNOSIS — Z79899 Other long term (current) drug therapy: Secondary | ICD-10-CM

## 2014-11-03 DIAGNOSIS — E1169 Type 2 diabetes mellitus with other specified complication: Secondary | ICD-10-CM

## 2014-11-03 LAB — COMPREHENSIVE METABOLIC PANEL
ALK PHOS: 46 U/L (ref 39–117)
ALT: 10 U/L (ref 0–35)
AST: 11 U/L (ref 0–37)
Albumin: 4 g/dL (ref 3.5–5.2)
BUN: 6 mg/dL (ref 6–23)
CO2: 27 meq/L (ref 19–32)
Calcium: 9.3 mg/dL (ref 8.4–10.5)
Chloride: 99 mEq/L (ref 96–112)
Creat: 0.6 mg/dL (ref 0.50–1.10)
Glucose, Bld: 107 mg/dL — ABNORMAL HIGH (ref 70–99)
Potassium: 4.2 mEq/L (ref 3.5–5.3)
SODIUM: 135 meq/L (ref 135–145)
TOTAL PROTEIN: 7.4 g/dL (ref 6.0–8.3)
Total Bilirubin: 0.2 mg/dL (ref 0.2–1.2)

## 2014-11-03 LAB — CBC WITH DIFFERENTIAL/PLATELET
Basophils Absolute: 0.1 10*3/uL (ref 0.0–0.1)
Basophils Relative: 1 % (ref 0–1)
Eosinophils Absolute: 0.2 10*3/uL (ref 0.0–0.7)
Eosinophils Relative: 3 % (ref 0–5)
HCT: 33.5 % — ABNORMAL LOW (ref 36.0–46.0)
Hemoglobin: 10.7 g/dL — ABNORMAL LOW (ref 12.0–15.0)
LYMPHS ABS: 1.7 10*3/uL (ref 0.7–4.0)
Lymphocytes Relative: 30 % (ref 12–46)
MCH: 23.3 pg — ABNORMAL LOW (ref 26.0–34.0)
MCHC: 31.9 g/dL (ref 30.0–36.0)
MCV: 73 fL — ABNORMAL LOW (ref 78.0–100.0)
Monocytes Absolute: 0.5 10*3/uL (ref 0.1–1.0)
Monocytes Relative: 9 % (ref 3–12)
NEUTROS ABS: 3.2 10*3/uL (ref 1.7–7.7)
NEUTROS PCT: 57 % (ref 43–77)
PLATELETS: 398 10*3/uL (ref 150–400)
RBC: 4.59 MIL/uL (ref 3.87–5.11)
RDW: 16.4 % — ABNORMAL HIGH (ref 11.5–15.5)
WBC: 5.7 10*3/uL (ref 4.0–10.5)

## 2014-11-03 LAB — LIPID PANEL
CHOLESTEROL: 116 mg/dL (ref 0–200)
HDL: 37 mg/dL — ABNORMAL LOW (ref >39)
LDL CALC: 60 mg/dL (ref 0–99)
Total CHOL/HDL Ratio: 3.1 Ratio
Triglycerides: 96 mg/dL (ref <150)
VLDL: 19 mg/dL (ref 0–40)

## 2014-11-03 LAB — POCT GLYCOSYLATED HEMOGLOBIN (HGB A1C): Hemoglobin A1C: 7.7

## 2014-11-03 MED ORDER — SIMVASTATIN 40 MG PO TABS: 40.0000 mg | ORAL_TABLET | Freq: Every day | ORAL | Status: DC

## 2014-11-03 MED ORDER — LISINOPRIL 20 MG PO TABS: 20.0000 mg | ORAL_TABLET | Freq: Every day | ORAL | Status: DC

## 2014-11-03 MED ORDER — METFORMIN HCL 1000 MG PO TABS: 1000.0000 mg | ORAL_TABLET | Freq: Two times a day (BID) | ORAL | Status: DC

## 2014-11-03 MED ORDER — INSULIN PEN NEEDLE 30G X 8 MM MISC: 1.0000 | Status: DC | PRN

## 2014-11-03 MED ORDER — FERROUS SULFATE 325 (65 FE) MG PO TABS: 325.0000 mg | ORAL_TABLET | Freq: Every day | ORAL | Status: DC

## 2014-11-03 MED ORDER — SAXAGLIPTIN HCL 5 MG PO TABS: 5.0000 mg | ORAL_TABLET | Freq: Every day | ORAL | Status: DC

## 2014-11-03 MED ORDER — GLUCOSE BLOOD VI STRP: ORAL_STRIP | Status: DC

## 2014-11-03 MED ORDER — INSULIN DETEMIR 100 UNIT/ML FLEXPEN: PEN_INJECTOR | SUBCUTANEOUS | Status: DC

## 2014-11-03 NOTE — Patient Instructions (Signed)
Walk every day for 15-20 minutes. Check your sugars either before a meal or 2 hours after a meal.

## 2014-11-03 NOTE — Progress Notes (Signed)
Subjective:    Heather Sherman is a 43 y.o. female who presents for follow-up of Type 2 diabetes mellitus.  She did not get her Onglyza filled due to cost of the medication. He has been checking her blood sugars but does occasionally check it immediately after a meal.she does complain of intermittent anal itching and states that Preparation H has not been very beneficial. Home blood sugar records: patient test bid 156 am 240 pm  Current symptoms/problems none Daily foot checks:  Any foot concerns: none Last eye exam:  2013   Medication compliance:fair Current diet: none Current exercise: working; however her work mainly consists of standing. Known diabetic complications: none Cardiovascular risk factors: diabetes mellitus, dyslipidemia, hypertension, obesity (BMI >= 30 kg/m2) and sedentary lifestyle   The following portions of the patient's history were reviewed and updated as appropriate: allergies, current medications, past medical history, past social history and problem list.  ROS as in subjective above    Objective:    General appearence: alert, no distress, WD/WN  Lab Review Lab Results  Component Value Date   HGBA1C 7.7 06/30/2014   Lab Results  Component Value Date   CHOL 115 10/28/2013   HDL 33* 10/28/2013   LDLCALC 66 10/28/2013   TRIG 80 10/28/2013   CHOLHDL 3.5 10/28/2013   No results found for: Derl Barrow   Chemistry      Component Value Date/Time   NA 136 10/28/2013 0934   K 4.3 10/28/2013 0934   CL 100 10/28/2013 0934   CO2 23 10/28/2013 0934   BUN 8 10/28/2013 0934   CREATININE 0.59 10/28/2013 0934   CREATININE 0.56 10/28/2012 0811      Component Value Date/Time   CALCIUM 9.2 10/28/2013 0934   ALKPHOS 39 10/28/2013 0934   AST 12 10/28/2013 0934   ALT 10 10/28/2013 0934   BILITOT 0.3 10/28/2013 0934        Chemistry      Component Value Date/Time   NA 136 10/28/2013 0934   K 4.3 10/28/2013 0934   CL 100 10/28/2013 0934   CO2 23 10/28/2013 0934   BUN 8 10/28/2013 0934   CREATININE 0.59 10/28/2013 0934   CREATININE 0.56 10/28/2012 0811      Component Value Date/Time   CALCIUM 9.2 10/28/2013 0934   ALKPHOS 39 10/28/2013 0934   AST 12 10/28/2013 0934   ALT 10 10/28/2013 0934   BILITOT 0.3 10/28/2013 0934     Hemoglobin A1c 7.7    Assessment:  Iron deficiency anemia - Plan: ferrous sulfate 325 (65 FE) MG tablet  Type 2 diabetes mellitus without complication - Plan: POCT glycosylated hemoglobin (Hb A1C), metFORMIN (GLUCOPHAGE) 1000 MG tablet, glucose blood (ONE TOUCH ULTRA TEST) test strip, saxagliptin HCl (ONGLYZA) 5 MG TABS tablet, Insulin Detemir (LEVEMIR FLEXTOUCH) 100 UNIT/ML Pen, Insulin Pen Needle (NOVOFINE) 30G X 8 MM MISC, CBC with Differential, Comprehensive metabolic panel, CANCELED: POCT UA - Microalbumin  Hypertension associated with diabetes - Plan: lisinopril (PRINIVIL,ZESTRIL) 20 MG tablet, CBC with Differential, Comprehensive metabolic panel  Anemia, unspecified anemia type - Plan: ferrous sulfate 325 (65 FE) MG tablet  Mixed hyperlipidemia due to type 2 diabetes mellitus - Plan: simvastatin (ZOCOR) 40 MG tablet, Lipid panel  Obesity (BMI 30-39.9)  Encounter for long-term (current) use of medications  Need for prophylactic vaccination and inoculation against influenza - Plan: Flu Vaccine QUAD 36+ mos IM        Plan:    1.  Rx changes: none 2.  Education: Reviewed 'ABCs' of diabetes management (respective goals in parentheses):  A1C (<7), blood pressure (<130/80), and cholesterol (LDL <100). 3.  Compliance at present is estimated to be fair. Efforts to improve compliance (if necessary) will be directed at increased exercise. 4. Follow up: 4 months   Encouraged her to get involved in a walking program during her breaks at work. Also recommend she check her blood sugar before a meal or 2 hours after meal to have more accurate reading.

## 2014-11-07 LAB — HM DIABETES EYE EXAM

## 2014-12-11 ENCOUNTER — Encounter: Payer: Self-pay | Admitting: Family Medicine

## 2014-12-12 ENCOUNTER — Encounter: Payer: Self-pay | Admitting: Internal Medicine

## 2015-02-27 ENCOUNTER — Telehealth: Payer: Self-pay | Admitting: Family Medicine

## 2015-02-27 MED ORDER — LINAGLIPTIN 5 MG PO TABS: 5.0000 mg | ORAL_TABLET | Freq: Every day | ORAL | Status: DC

## 2015-02-27 NOTE — Telephone Encounter (Signed)
Onglyza is not covered, covered alternatives are Januvia and Tradjenta.  Do you want to switch?

## 2015-03-05 ENCOUNTER — Encounter: Payer: Self-pay | Admitting: Family Medicine

## 2015-03-05 ENCOUNTER — Ambulatory Visit (INDEPENDENT_AMBULATORY_CARE_PROVIDER_SITE_OTHER): Payer: BLUE CROSS/BLUE SHIELD | Admitting: Family Medicine

## 2015-03-05 VITALS — BP 128/82 | HR 76 | Wt 206.0 lb

## 2015-03-05 DIAGNOSIS — I10 Essential (primary) hypertension: Secondary | ICD-10-CM | POA: Diagnosis not present

## 2015-03-05 DIAGNOSIS — E119 Type 2 diabetes mellitus without complications: Secondary | ICD-10-CM | POA: Diagnosis not present

## 2015-03-05 DIAGNOSIS — E1159 Type 2 diabetes mellitus with other circulatory complications: Secondary | ICD-10-CM

## 2015-03-05 DIAGNOSIS — E1169 Type 2 diabetes mellitus with other specified complication: Secondary | ICD-10-CM

## 2015-03-05 MED ORDER — INSULIN DETEMIR 100 UNIT/ML FLEXPEN
PEN_INJECTOR | SUBCUTANEOUS | Status: DC
Start: 2015-03-05 — End: 2015-09-10

## 2015-03-05 MED ORDER — METFORMIN HCL 1000 MG PO TABS: 1000.0000 mg | ORAL_TABLET | Freq: Two times a day (BID) | ORAL | Status: DC

## 2015-03-05 MED ORDER — LISINOPRIL 20 MG PO TABS: 20.0000 mg | ORAL_TABLET | Freq: Every day | ORAL | Status: DC

## 2015-03-05 NOTE — Progress Notes (Signed)
   Subjective:    Patient ID: Heather Sherman, female    DOB: 13-Nov-1971, 44 y.o.   MRN: [id number]  HPI She is here for a 4 month follow up for Diabetes type 2. She has no complaints, has been feeling well. Medication compliance good with Metformin and Levemir. She stopped taking Onglyza in past due to insurance issues. She has not yet picked up Tradjenta that was called in for her on March 4th. She states she has not been exercising and calls herself "lazy". She states she loves fried foods and starches. She did see the nutritionist in the past and states she knows that she should be eating healthier and exercising. She checks her blood sugar twice daily. In the morning before breakfast the readings have been between 113-170 and 2 hours after evening meal the readings are in the 300s. She reports some mild tingling in her feet and hands at night occasionally. Eye exam was last August.  She lives with her husband and children.    Review of Systems  All other systems reviewed and are negative.      Objective:   Physical Exam Alert and in no distress.  Cardiac exam shows a regular rate and rhythm. Lungs are clear to auscultation. Extremities warm and no edema.    A1C 7.8     Assessment & Plan:  Hypertension associated with diabetes - Plan: lisinopril (PRINIVIL,ZESTRIL) 20 MG tablet  Type 2 diabetes mellitus without complication - Plan: Insulin Detemir (LEVEMIR FLEXTOUCH) 100 UNIT/ML Pen, metFORMIN (GLUCOPHAGE) 1000 MG tablet  she has been through diabetes education including nutrition but admits to not applying any of these insights into her present lifestyle and eating regimen. Continue taking current medications and start taking Tradjenta. She will let me know if insurance does not cover this medication well. Strongly encouraged her to eat more fruits and vegetables and reduce starches and fried foods. Discussed that even 5-10 minutes of exercise would be beneficial.

## 2015-03-05 NOTE — Progress Notes (Deleted)
  Subjective:    Patient ID: Heather Sherman, female    DOB: 1971/08/24, 44 y.o.   MRN: [id number]  Heather Sherman is a 44 y.o. female who presents for follow-up of Type 2 diabetes mellitus.  Home blood sugar records: {diabetes glucometry results:16657} Current symptoms/problems include {Symptoms; diabetes:14075} and have been {Desc; course:15616}. Daily foot checks:   Any foot concerns: *** Exercise: {types:19826}  The following portions of the patient's history were reviewed and updated as appropriate: allergies, current medications, past medical history, past social history and problem list.  ROS as in subjective above.     Objective:    Physical Exam Alert and in no distress otherwise not examined.  Blood pressure 128/82, pulse 76, weight 206 lb (93.441 kg).  Lab Review Diabetic Labs Latest Ref Rng 11/03/2014 06/30/2014 02/27/2014 10/28/2013 06/21/2013  HbA1c - 7.7 7.7 7.8 7.9 8.3  Chol 0 - 200 mg/dL 116 - - 115 -  HDL >39 mg/dL 37(L) - - 33(L) -  Calc LDL 0 - 99 mg/dL 60 - - 66 -  Triglycerides <150 mg/dL 96 - - 80 -  Creatinine 0.50 - 1.10 mg/dL 0.60 - - 0.59 -   BP/Weight 03/05/2015 11/03/2014 06/30/2014 02/27/2014 90/01/1114  Systolic BP 520 802 233 612 244  Diastolic BP 82 80 80 82 78  Wt. (Lbs) 206 200 203 197 200  BMI 33.27 32.3 32.78 31.81 32.3   Foot/eye exam completion dates Latest Ref Rng 11/07/2014 06/30/2014  Eye Exam No Retinopathy No Retinopathy -  Foot Form Completion - - Done    [place]  reports that she has never smoked. She has never used smokeless tobacco. She reports that she does not drink alcohol or use illicit drugs.     Assessment & Plan:    No diagnosis found.  1. Rx changes: {none:33079} 2. Education: Reviewed 'ABCs' of diabetes management (respective goals in parentheses):  A1C (<7), blood pressure (<130/80), and cholesterol (LDL <100). 3. Compliance at present is estimated to be {good/fair/poor:33178}. Efforts to improve compliance (if  necessary) will be directed at {compliance:16716}. 4. Follow up: {NUMBERS; 0-10:33138} {time:11}

## 2015-07-13 ENCOUNTER — Encounter: Payer: Self-pay | Admitting: Family Medicine

## 2015-07-13 ENCOUNTER — Ambulatory Visit (INDEPENDENT_AMBULATORY_CARE_PROVIDER_SITE_OTHER): Payer: BLUE CROSS/BLUE SHIELD | Admitting: Family Medicine

## 2015-07-13 VITALS — BP 136/86 | HR 92 | Ht 66.0 in | Wt 206.0 lb

## 2015-07-13 DIAGNOSIS — D509 Iron deficiency anemia, unspecified: Secondary | ICD-10-CM | POA: Diagnosis not present

## 2015-07-13 DIAGNOSIS — E669 Obesity, unspecified: Secondary | ICD-10-CM

## 2015-07-13 DIAGNOSIS — E1159 Type 2 diabetes mellitus with other circulatory complications: Secondary | ICD-10-CM

## 2015-07-13 DIAGNOSIS — I10 Essential (primary) hypertension: Secondary | ICD-10-CM | POA: Diagnosis not present

## 2015-07-13 DIAGNOSIS — E118 Type 2 diabetes mellitus with unspecified complications: Secondary | ICD-10-CM | POA: Diagnosis not present

## 2015-07-13 DIAGNOSIS — I152 Hypertension secondary to endocrine disorders: Secondary | ICD-10-CM

## 2015-07-13 DIAGNOSIS — E782 Mixed hyperlipidemia: Secondary | ICD-10-CM

## 2015-07-13 DIAGNOSIS — E1169 Type 2 diabetes mellitus with other specified complication: Secondary | ICD-10-CM | POA: Diagnosis not present

## 2015-07-13 LAB — CBC WITH DIFFERENTIAL/PLATELET
BASOS ABS: 0.1 10*3/uL (ref 0.0–0.1)
BASOS PCT: 1 % (ref 0–1)
EOS ABS: 0.2 10*3/uL (ref 0.0–0.7)
Eosinophils Relative: 3 % (ref 0–5)
HCT: 32.2 % — ABNORMAL LOW (ref 36.0–46.0)
Hemoglobin: 10.4 g/dL — ABNORMAL LOW (ref 12.0–15.0)
LYMPHS PCT: 32 % (ref 12–46)
Lymphs Abs: 1.8 10*3/uL (ref 0.7–4.0)
MCH: 23.9 pg — ABNORMAL LOW (ref 26.0–34.0)
MCHC: 32.3 g/dL (ref 30.0–36.0)
MCV: 74 fL — AB (ref 78.0–100.0)
MPV: 10.1 fL (ref 8.6–12.4)
Monocytes Absolute: 0.5 10*3/uL (ref 0.1–1.0)
Monocytes Relative: 9 % (ref 3–12)
NEUTROS ABS: 3 10*3/uL (ref 1.7–7.7)
NEUTROS PCT: 55 % (ref 43–77)
Platelets: 346 10*3/uL (ref 150–400)
RBC: 4.35 MIL/uL (ref 3.87–5.11)
RDW: 15.2 % (ref 11.5–15.5)
WBC: 5.5 10*3/uL (ref 4.0–10.5)

## 2015-07-13 LAB — HEMOGLOBIN A1C
HEMOGLOBIN A1C: 9.3 % — AB (ref <5.7)
Mean Plasma Glucose: 220 mg/dL — ABNORMAL HIGH (ref <117)

## 2015-07-13 NOTE — Patient Instructions (Signed)
Check your blood sugars either before a meal or 2 hours after a meal. You can adjust your insulin based on your morning blood sugar. Your morning blood sugar below 120. You have 3 variables that you have to look at, calories that you take in, calories that you burn and medications. If you have questions you can call me

## 2015-07-13 NOTE — Progress Notes (Signed)
  Subjective:    Patient ID: Heather Sherman, female    DOB: 06/28/1971, 44 y.o.   MRN: [id number]  Heather Sherman is a 44 y.o. female who presents for follow-up of Type 2 diabetes mellitus.  Home blood sugar records: Patient test BID. She does not necessarily check her sugars around the time that she eats. She does not change her dosing regimen based on blood sugar either. Current symptoms/problem NONE Daily foot checks:yes   Any foot concerns: none Exercise: walking once a week 20 min Eye: 11/07/2014 She continues to use on iron to treat iron deficiency. The following portions of the patient's history were reviewed and updated as appropriate: allergies, current medications, past medical history, past social history and problem list.  ROS as in subjective above.     Objective:    Physical Exam Alert and in no distress otherwise not examined.    Lab Review Diabetic Labs Latest Ref Rng 11/03/2014 06/30/2014 02/27/2014 10/28/2013 06/21/2013  HbA1c - 7.7 7.7 7.8 7.9 8.3  Chol 0 - 200 mg/dL 116 - - 115 -  HDL >39 mg/dL 37(L) - - 33(L) -  Calc LDL 0 - 99 mg/dL 60 - - 66 -  Triglycerides <150 mg/dL 96 - - 80 -  Creatinine 0.50 - 1.10 mg/dL 0.60 - - 0.59 -   BP/Weight 03/05/2015 11/03/2014 06/30/2014 02/27/2014 63/0/1601  Systolic BP 093 235 573 220 254  Diastolic BP 82 80 80 82 78  Wt. (Lbs) 206 200 203 197 200  BMI 33.27 32.3 32.78 31.81 32.3   Foot/eye exam completion dates Latest Ref Rng 11/07/2014 06/30/2014  Eye Exam No Retinopathy No Retinopathy -  Foot Form Completion - - Done    Heather Sherman  reports that she has never smoked. She has never used smokeless tobacco. She reports that she does not drink alcohol or use illicit drugs.     Assessment & Plan:    Type 2 diabetes mellitus with complication - Plan: Hemoglobin A1c  Obesity (BMI 30-39.9)  Mixed hyperlipidemia due to type 2 diabetes mellitus  Hypertension associated with diabetes  Iron deficiency anemia - Plan: CBC with  Differential/Platelet   1. Rx changes: none 2. Education: Reviewed 'ABCs' of diabetes management (respective goals in parentheses):  A1C (<7), blood pressure (<130/80), and cholesterol (LDL <100). 3. Compliance at present is estimated to be fair. Efforts to improve compliance (if necessary) will be directed at increased exercise. 4. Follow up: 4 months Check your blood sugars either before a meal or 2 hours after a meal. You can adjust your insulin based on your morning blood sugar. Your morning blood sugar below 120. You have 3 variables that you have to look at, calories that you take in, calories that you burn and medications. If you have questions you can call me

## 2015-07-22 ENCOUNTER — Telehealth: Payer: Self-pay

## 2015-07-22 NOTE — Telephone Encounter (Signed)
Go up 2 units every 2 days until blood sugar under 120 but that shoes the morning dose first

## 2015-07-22 NOTE — Telephone Encounter (Signed)
Dr.Lalonde patient takes 30 units am and 38 units pm so how do you want me to tell her to move up 2 units every 2 days till she reaches 120

## 2015-08-13 ENCOUNTER — Ambulatory Visit (INDEPENDENT_AMBULATORY_CARE_PROVIDER_SITE_OTHER): Payer: BLUE CROSS/BLUE SHIELD | Admitting: Family Medicine

## 2015-08-13 ENCOUNTER — Encounter: Payer: Self-pay | Admitting: Family Medicine

## 2015-08-13 VITALS — BP 138/82 | HR 84 | Temp 98.3°F | Wt 202.0 lb

## 2015-08-13 DIAGNOSIS — L03114 Cellulitis of left upper limb: Secondary | ICD-10-CM | POA: Diagnosis not present

## 2015-08-13 DIAGNOSIS — L298 Other pruritus: Secondary | ICD-10-CM

## 2015-08-13 MED ORDER — DOXYCYCLINE HYCLATE 100 MG PO TABS: 100.0000 mg | ORAL_TABLET | Freq: Two times a day (BID) | ORAL | Status: DC

## 2015-08-13 NOTE — Progress Notes (Signed)
   Subjective:    Patient ID: Heather Sherman, female    DOB: 08/04/1971, 44 y.o.   MRN: [id number]  HPI She is here with a 2 weeks history of pruritic rash to left arm and right lower leg. She denies using new soap or detergent, no known exposure to account for rash. She has tried otc hydrocortisone and calamine lotion without relief. She states the rash seems to be getting worse and is sore to touch. Also states there is now a red raised area around the rash. Denies injury, fever, chills, recent travel.   Review of Systems Pertinent positives and negatives are in the history of present illness, systems otherwise negative.    Objective:   Physical Exam  Alert and in no distress. Erythematous rash to anterior left upper and lower arm and right anterior lower leg below the knee. Excoriation noted in same areas. Raised erythematous area noted around rash on left anterior arm.        Assessment & Plan:  Pruritic erythematous rash  Cellulitis of left upper extremity  Since the rash seems to be getting worse and she now appears to have possibly developed a minor secondary bacterial infection to her left arm I will prescribe an antibiotic. She may continue using hydrocortisone for itching and may take benadryl at night if the itching keeps her awake. Recommended using cold compresses to help alleviate itching also. Advised her to call if symptoms worsen.

## 2015-09-09 ENCOUNTER — Telehealth: Payer: Self-pay

## 2015-09-09 DIAGNOSIS — E119 Type 2 diabetes mellitus without complications: Secondary | ICD-10-CM

## 2015-09-09 NOTE — Telephone Encounter (Signed)
Refill request for Levemir-Flextouch 100 unit/ml pen

## 2015-09-10 MED ORDER — INSULIN DETEMIR 100 UNIT/ML FLEXPEN: PEN_INJECTOR | SUBCUTANEOUS | Status: DC

## 2015-11-16 ENCOUNTER — Ambulatory Visit: Payer: BLUE CROSS/BLUE SHIELD | Admitting: Family Medicine

## 2015-11-18 ENCOUNTER — Other Ambulatory Visit: Payer: Self-pay

## 2015-11-18 ENCOUNTER — Ambulatory Visit: Payer: Self-pay | Admitting: Family Medicine

## 2015-11-18 DIAGNOSIS — E119 Type 2 diabetes mellitus without complications: Secondary | ICD-10-CM

## 2015-11-18 DIAGNOSIS — E1169 Type 2 diabetes mellitus with other specified complication: Secondary | ICD-10-CM

## 2015-11-18 DIAGNOSIS — I152 Hypertension secondary to endocrine disorders: Secondary | ICD-10-CM

## 2015-11-18 DIAGNOSIS — I10 Essential (primary) hypertension: Secondary | ICD-10-CM

## 2015-11-18 DIAGNOSIS — E1159 Type 2 diabetes mellitus with other circulatory complications: Secondary | ICD-10-CM

## 2015-11-18 DIAGNOSIS — E782 Mixed hyperlipidemia: Principal | ICD-10-CM

## 2015-11-18 MED ORDER — METFORMIN HCL 1000 MG PO TABS: 1000.0000 mg | ORAL_TABLET | Freq: Two times a day (BID) | ORAL | Status: DC

## 2015-11-18 MED ORDER — SIMVASTATIN 40 MG PO TABS: 40.0000 mg | ORAL_TABLET | Freq: Every day | ORAL | Status: DC

## 2015-11-18 MED ORDER — LISINOPRIL 20 MG PO TABS: 20.0000 mg | ORAL_TABLET | Freq: Every day | ORAL | Status: DC

## 2016-01-05 ENCOUNTER — Encounter: Payer: Self-pay | Admitting: Family Medicine

## 2016-01-05 ENCOUNTER — Ambulatory Visit (INDEPENDENT_AMBULATORY_CARE_PROVIDER_SITE_OTHER): Payer: No Typology Code available for payment source | Admitting: Family Medicine

## 2016-01-05 VITALS — BP 140/72 | HR 116 | Ht 66.0 in | Wt 205.4 lb

## 2016-01-05 DIAGNOSIS — E119 Type 2 diabetes mellitus without complications: Secondary | ICD-10-CM

## 2016-01-05 DIAGNOSIS — E1159 Type 2 diabetes mellitus with other circulatory complications: Secondary | ICD-10-CM | POA: Diagnosis not present

## 2016-01-05 DIAGNOSIS — Z794 Long term (current) use of insulin: Secondary | ICD-10-CM | POA: Diagnosis not present

## 2016-01-05 DIAGNOSIS — E669 Obesity, unspecified: Secondary | ICD-10-CM | POA: Diagnosis not present

## 2016-01-05 DIAGNOSIS — I152 Hypertension secondary to endocrine disorders: Secondary | ICD-10-CM

## 2016-01-05 DIAGNOSIS — E782 Mixed hyperlipidemia: Secondary | ICD-10-CM | POA: Diagnosis not present

## 2016-01-05 DIAGNOSIS — I10 Essential (primary) hypertension: Secondary | ICD-10-CM

## 2016-01-05 DIAGNOSIS — E118 Type 2 diabetes mellitus with unspecified complications: Secondary | ICD-10-CM

## 2016-01-05 DIAGNOSIS — E1169 Type 2 diabetes mellitus with other specified complication: Secondary | ICD-10-CM

## 2016-01-05 LAB — POCT GLYCOSYLATED HEMOGLOBIN (HGB A1C): Hemoglobin A1C: 7.1

## 2016-01-05 MED ORDER — LISINOPRIL-HYDROCHLOROTHIAZIDE 20-12.5 MG PO TABS: 1.0000 | ORAL_TABLET | Freq: Every day | ORAL | Status: DC

## 2016-01-05 NOTE — Progress Notes (Signed)
  Subjective:    Patient ID: Heather Sherman, female    DOB: [birth year], 45 y.o.   MRN: [id number]  Heather Sherman is a 45 y.o. female who presents for follow-up of Type 2 diabetes mellitus.She was in between [employer] and fortunately did.use her insulin due to cost. She has been back on this for several months. She has not been checking her sugars as regularly she was like due to cost.  Patient is checking home blood sugars.   Home blood sugar records: BGs range between 153 and 280 How often is blood sugars being checked: once a week Current symptoms/problems include none and have been unchanged. Daily foot checks: yes    Any foot concerns: no Last eye exam: August 2015 Exercise: The patient does not participate in regular exercise at present.  The following portions of the patient's history were reviewed and updated as appropriate: allergies, current medications, past medical history, past social history and problem list.  ROS as in subjective above.     Objective:    Physical Exam Alert and in no distress otherwise not examined.  Blood pressure 140/72, pulse 116, height 5' 6"  (1.676 m), weight 205 lb 6.4 oz (93.169 kg), SpO2 99 %.  Lab Review Diabetic Labs Latest Ref Rng 01/05/2016 07/13/2015 11/03/2014 06/30/2014 02/27/2014  HbA1c - 7.1 9.3(H) 7.7 7.7 7.8  Chol 0 - 200 mg/dL - - 116 - -  HDL >39 mg/dL - - 37(L) - -  Calc LDL 0 - 99 mg/dL - - 60 - -  Triglycerides <150 mg/dL - - 96 - -  Creatinine 0.50 - 1.10 mg/dL - - 0.60 - -   BP/Weight 01/05/2016 08/13/2015 07/13/2015 03/05/2015 60/0/4599  Systolic BP 774 142 395 320 233  Diastolic BP 72 82 86 82 80  Wt. (Lbs) 205.4 202 206 206 200  BMI 33.17 32.62 33.27 33.27 32.3   Foot/eye exam completion dates Latest Ref Rng 11/07/2014 06/30/2014  Eye Exam No Retinopathy No Retinopathy -  Foot Form Completion - - Done  Hemoglobin A1c is 7.1  Heather Sherman  reports that she has never smoked. She has never used smokeless tobacco.  She reports that she does not drink alcohol or use illicit drugs.     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  Compliance at present is estimated to be fair. Efforts to improve compliance (if necessary) will be directed at increased exercise.  Follow up: 4 months She will go online and get a discount card to help with that.

## 2016-01-08 ENCOUNTER — Telehealth: Payer: Self-pay | Admitting: Family Medicine

## 2016-01-08 DIAGNOSIS — E119 Type 2 diabetes mellitus without complications: Secondary | ICD-10-CM

## 2016-01-08 MED ORDER — INSULIN PEN NEEDLE 30G X 8 MM MISC: Status: DC

## 2016-01-08 MED ORDER — INSULIN DETEMIR 100 UNIT/ML FLEXPEN: PEN_INJECTOR | SUBCUTANEOUS | Status: DC

## 2016-01-08 NOTE — Telephone Encounter (Signed)
Pt called and was seen the other day for diabetes and needs her levemir flex touch  refilled it was suppose to be sent in the other day and was not, pt uses [id number]- [place], [place] - [address]

## 2016-01-08 NOTE — Telephone Encounter (Signed)
i have refilled levemir flexpen and pen needles for 4 months

## 2016-05-03 ENCOUNTER — Ambulatory Visit (INDEPENDENT_AMBULATORY_CARE_PROVIDER_SITE_OTHER): Payer: No Typology Code available for payment source | Admitting: Family Medicine

## 2016-05-03 ENCOUNTER — Encounter: Payer: Self-pay | Admitting: Family Medicine

## 2016-05-03 ENCOUNTER — Telehealth: Payer: Self-pay | Admitting: Family Medicine

## 2016-05-03 VITALS — BP 140/80 | HR 64 | Wt 207.2 lb

## 2016-05-03 DIAGNOSIS — E782 Mixed hyperlipidemia: Secondary | ICD-10-CM | POA: Diagnosis not present

## 2016-05-03 DIAGNOSIS — E1159 Type 2 diabetes mellitus with other circulatory complications: Secondary | ICD-10-CM | POA: Diagnosis not present

## 2016-05-03 DIAGNOSIS — I10 Essential (primary) hypertension: Secondary | ICD-10-CM

## 2016-05-03 DIAGNOSIS — E669 Obesity, unspecified: Secondary | ICD-10-CM

## 2016-05-03 DIAGNOSIS — D649 Anemia, unspecified: Secondary | ICD-10-CM

## 2016-05-03 DIAGNOSIS — E118 Type 2 diabetes mellitus with unspecified complications: Secondary | ICD-10-CM | POA: Diagnosis not present

## 2016-05-03 DIAGNOSIS — E1169 Type 2 diabetes mellitus with other specified complication: Secondary | ICD-10-CM | POA: Diagnosis not present

## 2016-05-03 DIAGNOSIS — Z794 Long term (current) use of insulin: Secondary | ICD-10-CM

## 2016-05-03 DIAGNOSIS — D509 Iron deficiency anemia, unspecified: Secondary | ICD-10-CM

## 2016-05-03 LAB — POCT GLYCOSYLATED HEMOGLOBIN (HGB A1C): Hemoglobin A1C: 9.8

## 2016-05-03 MED ORDER — LISINOPRIL-HYDROCHLOROTHIAZIDE 20-12.5 MG PO TABS: 1.0000 | ORAL_TABLET | Freq: Every day | ORAL | Status: DC

## 2016-05-03 MED ORDER — METFORMIN HCL 1000 MG PO TABS: 1000.0000 mg | ORAL_TABLET | Freq: Two times a day (BID) | ORAL | Status: DC

## 2016-05-03 MED ORDER — AMLODIPINE BESYLATE 5 MG PO TABS: 5.0000 mg | ORAL_TABLET | Freq: Every day | ORAL | Status: DC

## 2016-05-03 MED ORDER — FERROUS SULFATE 325 (65 FE) MG PO TABS: 325.0000 mg | ORAL_TABLET | Freq: Every day | ORAL | Status: AC

## 2016-05-03 MED ORDER — INSULIN PEN NEEDLE 30G X 8 MM MISC: Status: DC

## 2016-05-03 MED ORDER — INSULIN GLARGINE 100 UNIT/ML SOLOSTAR PEN: PEN_INJECTOR | SUBCUTANEOUS | Status: DC

## 2016-05-03 MED ORDER — GLUCOSE BLOOD VI STRP: ORAL_STRIP | Status: AC

## 2016-05-03 MED ORDER — SIMVASTATIN 40 MG PO TABS: 40.0000 mg | ORAL_TABLET | Freq: Every day | ORAL | Status: DC

## 2016-05-03 NOTE — Telephone Encounter (Signed)
Im fine with her present dosing regimen.

## 2016-05-03 NOTE — Telephone Encounter (Signed)
I called jack and informed him Dr.Lalonde is happy with the way her meds are now

## 2016-05-03 NOTE — Progress Notes (Deleted)
  Subjective:    Patient ID: Heather Sherman, female    DOB: May 28, 1971, 45 y.o.   MRN: [id number]  Heather Sherman is a 45 y.o. female who presents for follow-up of Type 2 diabetes mellitus.  Patient are not checking home blood sugars.   Home blood sugar records: will check BS every once in a while How often is blood sugars being checked: suppose to check twice a day Current symptoms/problems include none and have been stable. Daily foot checks: yes   Any foot concerns:none Last eye exam: 2 years ago Exercise: walking  The following portions of the patient's history were reviewed and updated as appropriate: allergies, current medications, past medical history, past social history and problem list.  ROS as in subjective above.     Objective:    Physical Exam Alert and in no distress otherwise not examined.  There were no vitals taken for this visit.  Lab Review Diabetic Labs Latest Ref Rng 01/05/2016 07/13/2015 11/03/2014 06/30/2014 02/27/2014  HbA1c - 7.1 9.3(H) 7.7 7.7 7.8  Chol 0 - 200 mg/dL - - 116 - -  HDL >39 mg/dL - - 37(L) - -  Calc LDL 0 - 99 mg/dL - - 60 - -  Triglycerides <150 mg/dL - - 96 - -  Creatinine 0.50 - 1.10 mg/dL - - 0.60 - -   BP/Weight 01/05/2016 08/13/2015 07/13/2015 03/05/2015 69/05/2951  Systolic BP 841 324 401 027 253  Diastolic BP 72 82 86 82 80  Wt. (Lbs) 205.4 202 206 206 200  BMI 33.17 32.62 33.27 33.27 32.3   Foot/eye exam completion dates Latest Ref Rng 11/07/2014 06/30/2014  Eye Exam No Retinopathy No Retinopathy -  Foot Form Completion - - Done    Heather Sherman  reports that she has never smoked. She has never used smokeless tobacco. She reports that she does not drink alcohol or use illicit drugs.     Assessment & Plan:    No diagnosis found.  1. Rx changes: {none:33079} 2. Education: Reviewed 'ABCs' of diabetes management (respective goals in parentheses):  A1C (<7), blood pressure (<130/80), and cholesterol (LDL <100). 3. Compliance at  present is estimated to be {good/fair/poor:33178}. Efforts to improve compliance (if necessary) will be directed at {compliance:16716}. 4. Follow up: {NUMBERS; 0-10:33138} {time:11}

## 2016-05-03 NOTE — Progress Notes (Signed)
Subjective:    Patient ID: [name], female    DOB: 15-Nov-1971, 45 y.o.   MRN: [id number]  KIANI WURTZEL is a 45 y.o. female who presents for follow-up of Type 2 diabetes mellitus.  Patient is not checking home blood sugars and cannot give an average BS. However, she does know that she is suppose to check twice a day ( fasting and 2 hr post-prandial). Current symptoms/problems include none and have been stable. Daily foot checks: yes   Any foot concerns:none Last eye exam: 2 years ago, but says she will go soon to get an exam.   Exercise: She says she is walking around 20 mins 3 times a week but is open to increasing how often she does this. Diet:  She knows to avoid excess carbs and is only drinking diet sodas.  However, she thinks she could be doing a better job with her diet overall especially cutting down on the number of sweets.    she continues on her iron supplement. She has not been able to get her levemir insulin since November due to high cost ( $300/mo) after an insurance change at her workplace.   The following portions of the patient's history were reviewed and updated as appropriate: allergies, current medications, past medical history, past social history and problem list.  ROS as in subjective above.     Objective:    Physical Exam Alert and in no distress otherwise not examined.  Blood pressure 140/80, pulse 64, weight 207 lb 3.2 oz (93.985 kg).  Lab Review Diabetic Labs Latest Ref Rng 01/05/2016 07/13/2015 11/03/2014 06/30/2014 02/27/2014  HbA1c - 7.1 9.3(H) 7.7 7.7 7.8  Chol 0 - 200 mg/dL - - 116 - -  HDL >39 mg/dL - - 37(L) - -  Calc LDL 0 - 99 mg/dL - - 60 - -  Triglycerides <150 mg/dL - - 96 - -  Creatinine 0.50 - 1.10 mg/dL - - 0.60 - -   BP/Weight 05/03/2016 01/05/2016 08/13/2015 07/13/2015 5/40/0867  Systolic BP 619 509 326 712 458  Diastolic BP 80 72 82 86 82  Wt. (Lbs) 207.2 205.4 202 206 206  BMI 33.46 33.17 32.62 33.27 33.27   Foot/eye exam  completion dates Latest Ref Rng 11/07/2014 06/30/2014  Eye Exam No Retinopathy No Retinopathy -  Foot Form Completion - - Done    Mckynzie  reports that she has never smoked. She has never used smokeless tobacco. She reports that she does not drink alcohol or use illicit drugs.   A1c -- 9.8     Assessment & Plan:    Type 2 diabetes mellitus with complication, with long-term current use of insulin (HCC) - Plan: POCT glycosylated hemoglobin (Hb A1C), Insulin Glargine (LANTUS SOLOSTAR) 100 UNIT/ML Solostar Pen, glucose blood (ONE TOUCH ULTRA TEST) test strip, Insulin Pen Needle (NOVOFINE) 30G X 8 MM MISC, metFORMIN (GLUCOPHAGE) 1000 MG tablet  Obesity (BMI 30-39.9)  Mixed hyperlipidemia due to type 2 diabetes mellitus ([place]) - Plan: simvastatin (ZOCOR) 40 MG tablet  Hypertension associated with diabetes ([place]) - Plan: amLODipine (NORVASC) 5 MG tablet, lisinopril-hydrochlorothiazide (ZESTORETIC) 20-12.5 MG tablet  Iron deficiency anemia - Plan: ferrous sulfate 325 (65 FE) MG tablet  Anemia, unspecified anemia type - Plan: ferrous sulfate 325 (65 FE) MG tablet  1. Rx changes: Glargine insulin 30 units once / day started after she has been off insulin (levemir) since November due to financal and insurance issues.  She will follow up in a couple of  weeks with fasting blood sugar values to see if this dose needs adjusting.  Additionally her blood pressure has been consistently mildly elevated so will add amlodipine 38m today.   2. Education: Reviewed 'ABCs' of diabetes management (respective goals in parentheses):  A1C (<7), blood pressure (<130/80), and cholesterol (LDL <100). 3. Compliance at present is estimated to be fair. Efforts to improve compliance (if necessary) will be directed at regular blood sugar monitoring: 2 times daily.   Additionally, she has set a goal of having her weight to 190 by the next visit in 4 mo with dietary changes and increased amount of exercise.   4. Follow up: 4  months   History and physical exam conducted by DMarylen Ponto(Medical Student) in conjunction with Dr. LRedmond School

## 2016-05-03 NOTE — Telephone Encounter (Signed)
Pharmacist, Barnabas Lister, at [email or web address]  [email or web address] called to make Dr [employer] aware that pt has a drug to drug interaction between Amlodipine and Simvastatin 40 mg. [employer] said that if pt is on Amlodipine, it is suggested that pt be on Simvastatin 75m. Does Dr L37mwant pt on Simvastatin 40 mg as sent in?

## 2016-05-03 NOTE — Patient Instructions (Addendum)
Work on getting your morning fasting blood sugar down to 120. Just start with 30 units today in that see what that does.

## 2016-09-05 ENCOUNTER — Ambulatory Visit (INDEPENDENT_AMBULATORY_CARE_PROVIDER_SITE_OTHER): Payer: No Typology Code available for payment source | Admitting: Family Medicine

## 2016-09-05 ENCOUNTER — Encounter: Payer: Self-pay | Admitting: Family Medicine

## 2016-09-05 VITALS — BP 120/82 | HR 92 | Ht 66.0 in | Wt 204.0 lb

## 2016-09-05 DIAGNOSIS — E669 Obesity, unspecified: Secondary | ICD-10-CM

## 2016-09-05 DIAGNOSIS — E782 Mixed hyperlipidemia: Secondary | ICD-10-CM | POA: Diagnosis not present

## 2016-09-05 DIAGNOSIS — E1159 Type 2 diabetes mellitus with other circulatory complications: Secondary | ICD-10-CM

## 2016-09-05 DIAGNOSIS — E1169 Type 2 diabetes mellitus with other specified complication: Secondary | ICD-10-CM | POA: Diagnosis not present

## 2016-09-05 DIAGNOSIS — Z794 Long term (current) use of insulin: Secondary | ICD-10-CM | POA: Diagnosis not present

## 2016-09-05 DIAGNOSIS — I152 Hypertension secondary to endocrine disorders: Secondary | ICD-10-CM

## 2016-09-05 DIAGNOSIS — I10 Essential (primary) hypertension: Secondary | ICD-10-CM | POA: Diagnosis not present

## 2016-09-05 DIAGNOSIS — Z23 Encounter for immunization: Secondary | ICD-10-CM | POA: Diagnosis not present

## 2016-09-05 DIAGNOSIS — E118 Type 2 diabetes mellitus with unspecified complications: Secondary | ICD-10-CM | POA: Diagnosis not present

## 2016-09-05 LAB — CBC WITH DIFFERENTIAL/PLATELET
Basophils Absolute: 56 cells/uL (ref 0–200)
Basophils Relative: 1 %
EOS PCT: 2 %
Eosinophils Absolute: 112 cells/uL (ref 15–500)
HEMATOCRIT: 37.1 % (ref 35.0–45.0)
Hemoglobin: 12.6 g/dL (ref 11.7–15.5)
LYMPHS PCT: 33 %
Lymphs Abs: 1848 cells/uL (ref 850–3900)
MCH: 27.6 pg (ref 27.0–33.0)
MCHC: 34 g/dL (ref 32.0–36.0)
MCV: 81.4 fL (ref 80.0–100.0)
MONO ABS: 392 {cells}/uL (ref 200–950)
MPV: 11.1 fL (ref 7.5–12.5)
Monocytes Relative: 7 %
NEUTROS PCT: 57 %
Neutro Abs: 3192 cells/uL (ref 1500–7800)
Platelets: 297 10*3/uL (ref 140–400)
RBC: 4.56 MIL/uL (ref 3.80–5.10)
RDW: 13.3 % (ref 11.0–15.0)
WBC: 5.6 10*3/uL (ref 4.0–10.5)

## 2016-09-05 LAB — COMPREHENSIVE METABOLIC PANEL
ALT: 13 U/L (ref 6–29)
AST: 15 U/L (ref 10–30)
Albumin: 4.3 g/dL (ref 3.6–5.1)
Alkaline Phosphatase: 40 U/L (ref 33–115)
BILIRUBIN TOTAL: 0.4 mg/dL (ref 0.2–1.2)
BUN: 10 mg/dL (ref 7–25)
CALCIUM: 9.8 mg/dL (ref 8.6–10.2)
CHLORIDE: 100 mmol/L (ref 98–110)
CO2: 25 mmol/L (ref 20–31)
Creat: 0.57 mg/dL (ref 0.50–1.10)
GLUCOSE: 180 mg/dL — AB (ref 65–99)
Potassium: 4 mmol/L (ref 3.5–5.3)
Sodium: 134 mmol/L — ABNORMAL LOW (ref 135–146)
Total Protein: 7.5 g/dL (ref 6.1–8.1)

## 2016-09-05 LAB — POCT GLYCOSYLATED HEMOGLOBIN (HGB A1C): HEMOGLOBIN A1C: 10.4

## 2016-09-05 LAB — LIPID PANEL
CHOL/HDL RATIO: 4.2 ratio (ref <=5.0)
Cholesterol: 129 mg/dL (ref 125–200)
HDL: 31 mg/dL — AB (ref >=46)
LDL CALC: 78 mg/dL (ref <130)
Triglycerides: 99 mg/dL (ref <150)
VLDL: 20 mg/dL (ref <30)

## 2016-09-05 NOTE — Progress Notes (Signed)
Subjective:    Patient ID: Heather Sherman, female    DOB: 03-Oct-1971, 45 y.o.   MRN: [id number]  Heather Sherman is a 45 y.o. female who presents for follow-up of Type 2 diabetes mellitus.  Patient is checking home blood sugars.   Home blood sugar records: 160's How often is blood sugars being checked: once a day Current symptoms/problems  None  Daily foot checks: yes   Any foot concerns: none Last eye exam: 2015 She plans to get an exam this year Exercise: walking at the park He states that she has made some diet and exercise changes when she sees her blood sugars going up. She does really not want to increase her insulin dosing or her medication regimen. She is taking metformin twice per day as well as her simvastatin and having no trouble with that. She continues on lisinopril/HCTZ and amlodipine. The following portions of the patient's history were reviewed and updated as appropriate: allergies, current medications, past medical history, past social history and problem list.  ROS as in subjective above.     Objective:    Physical Exam Alert and in no distress Foot exam done and pulses normal Lab Review Diabetic Labs Latest Ref Rng & Units 05/03/2016 01/05/2016 07/13/2015 11/03/2014 06/30/2014  HbA1c - 9.8% 7.1 9.3(H) 7.7 7.7  Chol 0 - 200 mg/dL - - - 116 -  HDL >39 mg/dL - - - 37(L) -  Calc LDL 0 - 99 mg/dL - - - 60 -  Triglycerides <150 mg/dL - - - 96 -  Creatinine 0.50 - 1.10 mg/dL - - - 0.60 -   BP/Weight 05/03/2016 01/05/2016 08/13/2015 07/13/2015 6/43/3295  Systolic BP 188 416 606 301 601  Diastolic BP 80 72 82 86 82  Wt. (Lbs) 207.2 205.4 202 206 206  BMI 33.46 33.17 32.62 33.27 33.27   Foot/eye exam completion dates Latest Ref Rng & Units 11/07/2014 06/30/2014  Eye Exam No Retinopathy No Retinopathy -  Foot Form Completion - - Done  A1c is 10.4  [name]  reports that she has never smoked. She has never used smokeless tobacco. She reports that she does not drink alcohol  or use drugs.     Assessment & Plan:    Hypertension associated with diabetes ([place])  Mixed hyperlipidemia due to type 2 diabetes mellitus ([place]) - Plan: Amb Referral to Nutrition and Diabetic E, Lipid panel  Type 2 diabetes mellitus with complication, with long-term current use of insulin ([place]) - Plan: HgB A1c, Amb Referral to Nutrition and Diabetic E, CBC with Differential/Platelet, Comprehensive metabolic panel, Lipid panel, POCT UA - Microalbumin  Obesity (BMI 30-39.9) - Plan: Amb Referral to Nutrition and Diabetic E  Need for prophylactic vaccination and inoculation against influenza - Plan: Flu Vaccine QUAD 36+ mos PF IM (Fluarix & Fluzone Quad PF)   1. Rx changes: none 2. Education: Reviewed 'ABCs' of diabetes management (respective goals in parentheses):  A1C (<7), blood pressure (<130/80), and cholesterol (LDL <100). 3. Compliance at present is estimated to be poor. Efforts to improve compliance (if necessary) will be directed at increased exercise. Cuss taking 5 minutes out of her morning break, 10 out of her lunch break and another 5 out of the afternoon break to help with this. I will also refer to diabetes and nutrition. 4. Follow up: 4 months Discussed the fact that we only have 3 variables to work with, diet, exercise and medications. If her numbers do not improve with her next visit, we  will need to add a medication to her regimen.

## 2016-09-05 NOTE — Patient Instructions (Addendum)
Check your blood sugars either before a meal or 2 hours after a meal 20 minutes of something physical every day

## 2016-09-09 ENCOUNTER — Encounter: Payer: Self-pay | Admitting: Internal Medicine

## 2016-10-29 ENCOUNTER — Other Ambulatory Visit: Payer: Self-pay | Admitting: Family Medicine

## 2016-10-29 DIAGNOSIS — E118 Type 2 diabetes mellitus with unspecified complications: Secondary | ICD-10-CM

## 2016-10-29 DIAGNOSIS — Z794 Long term (current) use of insulin: Principal | ICD-10-CM

## 2017-01-16 ENCOUNTER — Encounter: Payer: Self-pay | Admitting: Family Medicine

## 2017-01-16 ENCOUNTER — Ambulatory Visit (INDEPENDENT_AMBULATORY_CARE_PROVIDER_SITE_OTHER): Payer: No Typology Code available for payment source | Admitting: Family Medicine

## 2017-01-16 VITALS — BP 126/80 | HR 93 | Wt 197.6 lb

## 2017-01-16 DIAGNOSIS — E118 Type 2 diabetes mellitus with unspecified complications: Secondary | ICD-10-CM | POA: Diagnosis not present

## 2017-01-16 DIAGNOSIS — Z794 Long term (current) use of insulin: Secondary | ICD-10-CM | POA: Diagnosis not present

## 2017-01-16 DIAGNOSIS — E1169 Type 2 diabetes mellitus with other specified complication: Secondary | ICD-10-CM

## 2017-01-16 DIAGNOSIS — E1159 Type 2 diabetes mellitus with other circulatory complications: Secondary | ICD-10-CM

## 2017-01-16 DIAGNOSIS — E782 Mixed hyperlipidemia: Secondary | ICD-10-CM

## 2017-01-16 DIAGNOSIS — I10 Essential (primary) hypertension: Secondary | ICD-10-CM | POA: Diagnosis not present

## 2017-01-16 DIAGNOSIS — E669 Obesity, unspecified: Secondary | ICD-10-CM | POA: Diagnosis not present

## 2017-01-16 DIAGNOSIS — I152 Hypertension secondary to endocrine disorders: Secondary | ICD-10-CM

## 2017-01-16 LAB — POCT GLYCOSYLATED HEMOGLOBIN (HGB A1C)

## 2017-01-16 NOTE — Progress Notes (Signed)
  Subjective:    Patient ID: Heather Sherman, female    DOB: [birth year], 46 y.o.   MRN: [id number]  Heather Sherman is a 46 y.o. female who presents for follow-up of Type 2 diabetes mellitus.  Patient is checking home blood sugars.   Home blood sugar records: 182 this morning 1/22 How often is blood sugars being checked: once a week Current symptoms/problems include none and have been stable. Daily foot checks: yes   Any foot concerns: none Last eye exam: 2016 Exercise: 3 times a week- 15-20 mins walking Presently she is only taking the metformin, simvastatin and lisinopril. She states that she cannot afford the other avocations including her insulin. She has stopped them over a year ago. The following portions of the patient's history were reviewed and updated as appropriate: allergies, current medications, past medical history, past social history and problem list.  ROS as in subjective above.     Objective:    Physical Exam Alert and in no distress otherwise not examined.  Weight 197 lb 9.6 oz (89.6 kg).  Lab Review Diabetic Labs Latest Ref Rng & Units 09/05/2016 05/03/2016 01/05/2016 07/13/2015 11/03/2014  HbA1c - 10.4 9.8% 7.1 9.3(H) 7.7  Microalbumin mg/L - - - - -  Micro/Creat Ratio - - - - - -  Chol 125 - 200 mg/dL 129 - - - 116  HDL >=46 mg/dL 31(L) - - - 37(L)  Calc LDL <130 mg/dL 78 - - - 60  Triglycerides <150 mg/dL 99 - - - 96  Creatinine 0.50 - 1.10 mg/dL 0.57 - - - 0.60   BP/Weight 01/16/2017 09/05/2016 05/03/2016 01/05/2016 2/90/2111  Systolic BP - 552 080 223 361  Diastolic BP - 82 80 72 82  Wt. (Lbs) 197.6 204 207.2 205.4 202  BMI 31.89 32.93 33.46 33.17 32.62   Foot/eye exam completion dates Latest Ref Rng & Units 09/05/2016 11/07/2014  Eye Exam No Retinopathy - No Retinopathy  Foot Form Completion - Done -  A1c is 9.4  Heather Sherman  reports that she has never smoked. She has never used smokeless tobacco. She reports that she does not drink alcohol or use  drugs.     Assessment & Plan:    Type 2 diabetes mellitus with complication, with long-term current use of insulin (HCC) - Plan: HgB A1c  Hypertension associated with diabetes ([place])  Mixed hyperlipidemia due to type 2 diabetes mellitus ([place])  Obesity (BMI 30-39.9)   1. Rx changes: We will check with her insurance to see the cost of various medications including insulin. I will place her on medications that would be less expensive for her. 2. Education: Reviewed 'ABCs' of diabetes management (respective goals in parentheses):  A1C (<7), blood pressure (<130/80), and cholesterol (LDL <100). 3. Compliance at present is estimated to be poor. Efforts to improve compliance (if necessary) will be directed at She has made dietary and exercise changes and has shown some improvement with her weight.. 4. Follow up: 4 months

## 2017-01-17 ENCOUNTER — Telehealth: Payer: Self-pay | Admitting: Family Medicine

## 2017-01-17 NOTE — Telephone Encounter (Signed)
I called pt's pharmacy benefits administrator Terie Purser T# [phone] and her plan only covers Tier 1 generic and there are no covered insulins.  So patient has to pay out of pocket for her insulins.   I called [facility] t# [phone] and they have a Relion insulin which is over the counter NPH 70/30 insulin and it is $24 a bottle.  Also the following are the prices for her other medications  Lisinopril 32m #30/ $4 HCTZ 12.525mcapsules #30/$4 Metformin 100042m60/ $4  Ferrous Sulfate OTC #30/$5  Simvastatin is NOT on the $4 list

## 2017-01-17 NOTE — Telephone Encounter (Signed)
Find out what ever tier 1 or tier 2 diabetes medications they cover.

## 2017-01-19 MED ORDER — GLYBURIDE 5 MG PO TABS: 5.0000 mg | ORAL_TABLET | Freq: Every day | ORAL | 5 refills | Status: DC

## 2017-01-19 NOTE — Telephone Encounter (Signed)
I called pt's insurance company again just to verify what they said yesterday was true.  Pt's insurance does not pay for any insulins.  They only pay for a few generic diabetes medications & those are pill form only.  The covered meds are Glipizide, Metformin HCL, Glyburide and the co pay is $15 for 30 days.  The only option for pt is to pay out of pocket for her insulin.  See previous message about Relion insulin.

## 2017-01-19 NOTE — Telephone Encounter (Signed)
Her insurance will not cover insulin. I will place her on glyburide and continue metformin. Return here in several months.

## 2017-01-19 NOTE — Telephone Encounter (Signed)
Her know that I called a new medicine in and have her follow-up with me with her next diabetes appointment. Make sure she has one in roughly 4 months

## 2017-01-25 NOTE — Telephone Encounter (Signed)
Pt informed and appt has been set up

## 2017-04-09 ENCOUNTER — Other Ambulatory Visit: Payer: Self-pay | Admitting: Family Medicine

## 2017-04-09 DIAGNOSIS — I10 Essential (primary) hypertension: Principal | ICD-10-CM

## 2017-04-09 DIAGNOSIS — I152 Hypertension secondary to endocrine disorders: Secondary | ICD-10-CM

## 2017-04-09 DIAGNOSIS — E1159 Type 2 diabetes mellitus with other circulatory complications: Secondary | ICD-10-CM

## 2017-04-24 ENCOUNTER — Telehealth: Payer: Self-pay

## 2017-04-24 ENCOUNTER — Other Ambulatory Visit: Payer: Self-pay

## 2017-04-24 MED ORDER — GLYBURIDE 5 MG PO TABS: 5.0000 mg | ORAL_TABLET | Freq: Every day | ORAL | 1 refills | Status: DC

## 2017-04-24 NOTE — Telephone Encounter (Signed)
She got # 30 with 5 refills in Jan.

## 2017-04-24 NOTE — Telephone Encounter (Signed)
Requesting 90 day fill

## 2017-04-24 NOTE — Telephone Encounter (Signed)
Pt needs refill of glyburide [facility]m [facility] [address] [facility]

## 2017-04-24 NOTE — Telephone Encounter (Signed)
Sent in

## 2017-05-08 ENCOUNTER — Other Ambulatory Visit: Payer: Self-pay | Admitting: Family Medicine

## 2017-05-08 DIAGNOSIS — Z794 Long term (current) use of insulin: Principal | ICD-10-CM

## 2017-05-08 DIAGNOSIS — E1169 Type 2 diabetes mellitus with other specified complication: Secondary | ICD-10-CM

## 2017-05-08 DIAGNOSIS — E782 Mixed hyperlipidemia: Secondary | ICD-10-CM

## 2017-05-08 DIAGNOSIS — E118 Type 2 diabetes mellitus with unspecified complications: Secondary | ICD-10-CM

## 2017-05-15 ENCOUNTER — Ambulatory Visit (INDEPENDENT_AMBULATORY_CARE_PROVIDER_SITE_OTHER): Payer: No Typology Code available for payment source | Admitting: Family Medicine

## 2017-05-15 ENCOUNTER — Encounter: Payer: Self-pay | Admitting: Family Medicine

## 2017-05-15 VITALS — BP 124/80 | HR 85 | Ht 66.0 in | Wt 200.0 lb

## 2017-05-15 DIAGNOSIS — E1159 Type 2 diabetes mellitus with other circulatory complications: Secondary | ICD-10-CM | POA: Diagnosis not present

## 2017-05-15 DIAGNOSIS — E669 Obesity, unspecified: Secondary | ICD-10-CM

## 2017-05-15 DIAGNOSIS — E1169 Type 2 diabetes mellitus with other specified complication: Secondary | ICD-10-CM | POA: Diagnosis not present

## 2017-05-15 DIAGNOSIS — E118 Type 2 diabetes mellitus with unspecified complications: Secondary | ICD-10-CM | POA: Diagnosis not present

## 2017-05-15 DIAGNOSIS — I10 Essential (primary) hypertension: Secondary | ICD-10-CM | POA: Diagnosis not present

## 2017-05-15 DIAGNOSIS — E782 Mixed hyperlipidemia: Secondary | ICD-10-CM | POA: Diagnosis not present

## 2017-05-15 LAB — POCT GLYCOSYLATED HEMOGLOBIN (HGB A1C): Hemoglobin A1C: 7.8

## 2017-05-15 NOTE — Patient Instructions (Signed)
Walk 5 minutes for your morning break, 10 your lunch break and another 5 in your afternoon break inside. Get into see the eye doctor and have them send me a note

## 2017-05-15 NOTE — Progress Notes (Signed)
  Subjective:    Patient ID: Heather Sherman, female    DOB: 02-05-1971, 46 y.o.   MRN: [id number]  Heather Sherman is a 46 y.o. female who presents for follow-up of Type 2 diabetes mellitus.  Patient NOT checking home blood sugars.   Home blood sugar records: N/A How often is blood sugars being checked: N/A Current symptoms/problems None Daily foot checks: yes   Any foot concerns: none Last eye exam: 11/07/14 Exercise: walking 15 min mon tues and wed. She is not taking metformin and glyburide for her diabetes. Her insurance would not cover for insulin. She has not been checking her blood sugars. She continues to do well on her simvastatin and lisinopril.  The following portions of the patient's history were reviewed and updated as appropriate: allergies, current medications, past medical history, past social history and problem list.  ROS as in subjective above.     Objective:    Physical Exam Alert and in no distress otherwise not examined.   Lab Review Diabetic Labs Latest Ref Rng & Units 01/16/2017 09/05/2016 05/03/2016 01/05/2016 07/13/2015  HbA1c - 9.4% 10.4 9.8% 7.1 9.3(H)  Microalbumin mg/L - - - - -  Micro/Creat Ratio - - - - - -  Chol 125 - 200 mg/dL - 129 - - -  HDL >=46 mg/dL - 31(L) - - -  Calc LDL <130 mg/dL - 78 - - -  Triglycerides <150 mg/dL - 99 - - -  Creatinine 0.50 - 1.10 mg/dL - 0.57 - - -   BP/Weight 01/16/2017 09/05/2016 05/03/2016 01/05/2016 7/49/3552  Systolic BP 174 715 953 967 289  Diastolic BP 80 82 80 72 82  Wt. (Lbs) 197.6 204 207.2 205.4 202  BMI 31.89 32.93 33.46 33.17 32.62   Foot/eye exam completion dates Latest Ref Rng & Units 09/05/2016 11/07/2014  Eye Exam No Retinopathy - No Retinopathy  Foot Form Completion - Done -  A1c is 7.8  Heather Sherman  reports that she has never smoked. She has never used smokeless tobacco. She reports that she does not drink alcohol or use drugs.     Assessment & Plan:    Hypertension associated with diabetes  ([place])  Mixed hyperlipidemia due to type 2 diabetes mellitus ([place])  Type 2 diabetes mellitus with complication, without long-term current use of insulin ([place]) - Plan: HgB A1c  Obesity (BMI 30-39.9)   1. Rx changes: none 2. Education: Reviewed 'ABCs' of diabetes management (respective goals in parentheses):  A1C (<7), blood pressure (<130/80), and cholesterol (LDL <100). 3. Compliance at present is estimated to be good. Efforts to improve compliance (if necessary) will be directed at dietary modifications: - and increased exercise. Recommend 5 minutes of walking in the morning break, tented her lunch break and then 5 minutes in the afternoon break. 4. Follow up: 4 months She was quite happy with her new A1c. Discussed increasing checking her blood sugars periodically. Also encouraged her to get an eye appointment based on her insurance plan.

## 2017-09-18 ENCOUNTER — Encounter: Payer: Self-pay | Admitting: Family Medicine

## 2017-09-18 ENCOUNTER — Ambulatory Visit (INDEPENDENT_AMBULATORY_CARE_PROVIDER_SITE_OTHER): Payer: No Typology Code available for payment source | Admitting: Family Medicine

## 2017-09-18 VITALS — BP 132/72 | HR 89 | Resp 18 | Wt 207.8 lb

## 2017-09-18 DIAGNOSIS — Z794 Long term (current) use of insulin: Secondary | ICD-10-CM

## 2017-09-18 DIAGNOSIS — E782 Mixed hyperlipidemia: Secondary | ICD-10-CM

## 2017-09-18 DIAGNOSIS — E1159 Type 2 diabetes mellitus with other circulatory complications: Secondary | ICD-10-CM | POA: Diagnosis not present

## 2017-09-18 DIAGNOSIS — Z23 Encounter for immunization: Secondary | ICD-10-CM

## 2017-09-18 DIAGNOSIS — E1169 Type 2 diabetes mellitus with other specified complication: Secondary | ICD-10-CM | POA: Diagnosis not present

## 2017-09-18 DIAGNOSIS — E118 Type 2 diabetes mellitus with unspecified complications: Secondary | ICD-10-CM | POA: Diagnosis not present

## 2017-09-18 DIAGNOSIS — I10 Essential (primary) hypertension: Secondary | ICD-10-CM | POA: Diagnosis not present

## 2017-09-18 LAB — POCT UA - MICROALBUMIN: CREATININE, POC: 36.5 mg/dL

## 2017-09-18 LAB — POCT GLYCOSYLATED HEMOGLOBIN (HGB A1C): Hemoglobin A1C: 7.8

## 2017-09-18 MED ORDER — GLYBURIDE 5 MG PO TABS: 5.0000 mg | ORAL_TABLET | Freq: Every day | ORAL | 1 refills | Status: DC

## 2017-09-18 MED ORDER — METFORMIN HCL 1000 MG PO TABS: 1000.0000 mg | ORAL_TABLET | Freq: Two times a day (BID) | ORAL | 1 refills | Status: DC

## 2017-09-18 MED ORDER — LISINOPRIL-HYDROCHLOROTHIAZIDE 20-12.5 MG PO TABS: 1.0000 | ORAL_TABLET | Freq: Every day | ORAL | 2 refills | Status: DC

## 2017-09-18 MED ORDER — SIMVASTATIN 40 MG PO TABS: 40.0000 mg | ORAL_TABLET | Freq: Every day | ORAL | 3 refills | Status: DC

## 2017-09-18 NOTE — Progress Notes (Signed)
Subjective:    Patient ID: Heather Sherman, female    DOB: [birth year], 46 y.o.   MRN: [id number]  Heather Sherman is a 46 y.o. female who presents for follow-up of Type 2 diabetes mellitus.  Patient is checking home blood sugars.   Home blood sugar records: around 70-80 How often is blood sugars being checked: once to twice Current symptoms/problems include fingers getting numb and have been unchanged. Daily foot checks: yes   Any foot concerns: no Last eye exam: 3 yrs ago - pt states that she has had a lot going on.  Exercise: Home exercise routine includes walking 15 mins 3x/week . He continues on metformin and glyburide without difficulty. She also is taking simvastatin and having no leg aches. She continues on OTC iron supplements. The following portions of the patient's history were reviewed and updated as appropriate: allergies, current medications, past medical history, past social history and problem list.  ROS as in subjective above.     Objective:    Physical Exam Alert and in no distress. Tympanic membranes and canals are normal. Pharyngeal area is normal. Neck is supple without adenopathy or thyromegaly. Cardiac exam shows a regular sinus rhythm without murmurs or gallops. Lungs are clear to auscultation. Foot exam normal. Lab Review Diabetic Labs Latest Ref Rng & Units 09/18/2017 05/15/2017 01/16/2017 09/05/2016 05/03/2016  HbA1c - 7.8 7.8 9.4% 10.4 9.8%  Microalbumin mg/L <5.0 - - - -  Micro/Creat Ratio - <13.7 - - - -  Chol 125 - 200 mg/dL - - - 129 -  HDL >=46 mg/dL - - - 31(L) -  Calc LDL <130 mg/dL - - - 78 -  Triglycerides <150 mg/dL - - - 99 -  Creatinine 0.50 - 1.10 mg/dL - - - 0.57 -   BP/Weight 09/18/2017 05/15/2017 01/16/2017 6/81/2751 7/0/0174  Systolic BP 944 967 591 638 466  Diastolic BP 72 80 80 82 80  Wt. (Lbs) 207.8 200 197.6 204 207.2  BMI 33.54 32.28 31.89 32.93 33.46   Foot/eye exam completion dates Latest Ref Rng & Units 09/18/2017 09/05/2016    Eye Exam No Retinopathy - -  Foot Form Completion - Done Done  A1c is 7.8.  Heather Sherman  reports that she has never smoked. She has never used smokeless tobacco. She reports that she does not drink alcohol or use drugs.     Assessment & Plan:    Type 2 diabetes mellitus with complication, without long-term current use of insulin (HCC) - Plan: POCT UA - Microalbumin, HgB A1c  Need for influenza vaccination - Plan: Flu Vaccine QUAD 6+ mos PF IM (Fluarix Quad PF)  Mixed hyperlipidemia due to type 2 diabetes mellitus ([name]) - Plan: simvastatin (ZOCOR) 40 MG tablet, Lipid panel  Type 2 diabetes mellitus with complication, with long-term current use of insulin ([name]) - Plan: metFORMIN (GLUCOPHAGE) 1000 MG tablet, glyBURIDE (DIABETA) 5 MG tablet, CBC with Differential/Platelet, Comprehensive metabolic panel, Lipid panel  Hypertension associated with diabetes ([name]) - Plan: lisinopril-hydrochlorothiazide (PRINZIDE,ZESTORETIC) 20-12.5 MG tablet, CBC with Differential/Platelet, Comprehensive metabolic panel  1. Rx changes: none 2. Education: Reviewed 'ABCs' of diabetes management (respective goals in parentheses):  A1C (<7), blood pressure (<130/80), and cholesterol (LDL <100). 3. Compliance at present is estimated to be good. Efforts to improve compliance (if necessary) will be directed at increased exercise. 4. Follow up: 4 months I discussed her A1c of 7.8 and the fact that she seems to be maintaining. Explained that eventually this number will go up  even if she continues to take decent care of herself. Strongly encouraged her to increase her physical activity.

## 2017-09-18 NOTE — Patient Instructions (Signed)
20 minutes of something physical every day

## 2017-09-19 LAB — CBC WITH DIFFERENTIAL/PLATELET
BASOS ABS: 50 {cells}/uL (ref 0–200)
BASOS PCT: 0.8 %
EOS ABS: 132 {cells}/uL (ref 15–500)
Eosinophils Relative: 2.1 %
HEMATOCRIT: 35.6 % (ref 35.0–45.0)
HEMOGLOBIN: 11.9 g/dL (ref 11.7–15.5)
Lymphs Abs: 1846 cells/uL (ref 850–3900)
MCH: 26.7 pg — AB (ref 27.0–33.0)
MCHC: 33.4 g/dL (ref 32.0–36.0)
MCV: 79.8 fL — AB (ref 80.0–100.0)
MONOS PCT: 7.3 %
MPV: 11.6 fL (ref 7.5–12.5)
NEUTROS ABS: 3812 {cells}/uL (ref 1500–7800)
Neutrophils Relative %: 60.5 %
Platelets: 311 10*3/uL (ref 140–400)
RBC: 4.46 10*6/uL (ref 3.80–5.10)
RDW: 13 % (ref 11.0–15.0)
Total Lymphocyte: 29.3 %
WBC: 6.3 10*3/uL (ref 3.8–10.8)
WBCMIX: 460 {cells}/uL (ref 200–950)

## 2017-09-19 LAB — COMPREHENSIVE METABOLIC PANEL
AG Ratio: 1.2 (calc) (ref 1.0–2.5)
ALT: 9 U/L (ref 6–29)
AST: 11 U/L (ref 10–35)
Albumin: 4.1 g/dL (ref 3.6–5.1)
Alkaline phosphatase (APISO): 38 U/L (ref 33–115)
BUN: 12 mg/dL (ref 7–25)
CO2: 23 mmol/L (ref 20–32)
Calcium: 9.5 mg/dL (ref 8.6–10.2)
Chloride: 101 mmol/L (ref 98–110)
Creat: 0.55 mg/dL (ref 0.50–1.10)
Globulin: 3.4 g/dL (calc) (ref 1.9–3.7)
Glucose, Bld: 98 mg/dL (ref 65–99)
Potassium: 4 mmol/L (ref 3.5–5.3)
Sodium: 136 mmol/L (ref 135–146)
Total Bilirubin: 0.3 mg/dL (ref 0.2–1.2)
Total Protein: 7.5 g/dL (ref 6.1–8.1)

## 2017-09-19 LAB — LIPID PANEL
Cholesterol: 131 mg/dL (ref <200)
HDL: 36 mg/dL — ABNORMAL LOW (ref >50)
LDL Cholesterol (Calc): 78 mg/dL (calc)
Non-HDL Cholesterol (Calc): 95 mg/dL (calc) (ref <130)
Total CHOL/HDL Ratio: 3.6 (calc) (ref <5.0)
Triglycerides: 89 mg/dL (ref <150)

## 2018-01-22 ENCOUNTER — Ambulatory Visit (INDEPENDENT_AMBULATORY_CARE_PROVIDER_SITE_OTHER): Payer: No Typology Code available for payment source | Admitting: Family Medicine

## 2018-01-22 ENCOUNTER — Encounter: Payer: Self-pay | Admitting: Family Medicine

## 2018-01-22 VITALS — BP 126/84 | HR 87 | Wt 207.4 lb

## 2018-01-22 DIAGNOSIS — Z1231 Encounter for screening mammogram for malignant neoplasm of breast: Secondary | ICD-10-CM | POA: Diagnosis not present

## 2018-01-22 DIAGNOSIS — E1159 Type 2 diabetes mellitus with other circulatory complications: Secondary | ICD-10-CM | POA: Diagnosis not present

## 2018-01-22 DIAGNOSIS — E669 Obesity, unspecified: Secondary | ICD-10-CM | POA: Diagnosis not present

## 2018-01-22 DIAGNOSIS — E1169 Type 2 diabetes mellitus with other specified complication: Secondary | ICD-10-CM | POA: Diagnosis not present

## 2018-01-22 DIAGNOSIS — E782 Mixed hyperlipidemia: Secondary | ICD-10-CM

## 2018-01-22 DIAGNOSIS — Z23 Encounter for immunization: Secondary | ICD-10-CM | POA: Diagnosis not present

## 2018-01-22 DIAGNOSIS — Z1239 Encounter for other screening for malignant neoplasm of breast: Secondary | ICD-10-CM

## 2018-01-22 DIAGNOSIS — E119 Type 2 diabetes mellitus without complications: Secondary | ICD-10-CM

## 2018-01-22 DIAGNOSIS — I10 Essential (primary) hypertension: Secondary | ICD-10-CM

## 2018-01-22 DIAGNOSIS — E118 Type 2 diabetes mellitus with unspecified complications: Secondary | ICD-10-CM | POA: Diagnosis not present

## 2018-01-22 LAB — POCT GLYCOSYLATED HEMOGLOBIN (HGB A1C): Hemoglobin A1C: 8.8

## 2018-01-22 NOTE — Patient Instructions (Signed)
20 minutes of something physical every day or a total of 150 minutes of something every week

## 2018-01-22 NOTE — Progress Notes (Signed)
  Subjective:    Patient ID: Heather Sherman, female    DOB: 1971/10/22, 47 y.o.   MRN: [id number]  Heather Sherman is a 47 y.o. female who presents for follow-up of Type 2 diabetes mellitus.  Patient is checking home blood sugars.   Home blood sugar records:100-280 How often is blood sugars being checked:once a day Current symptoms/problems include none and have been unchanged. Daily foot checks: yes  Any foot concerns: no Last eye exam:two years Exercise: walk for 15 min every other day.  She also admits that she over ate over the holiday season.  She continues on her iron supplementation.  She also taking glyburide as well as metformin.  Continues on lisinopril without difficulty.  Also taking Zocor and having no aches or pains with that.  Her Pap is due but she would like to postpone that. The following portions of the patient's history were reviewed and updated as appropriate: allergies, current medications, past medical history, past social history and problem list.  ROS as in subjective above.     Objective:    Physical Exam Alert and in no distress otherwise not examined.  Lab Review Diabetic Labs Latest Ref Rng & Units 09/18/2017 05/15/2017 01/16/2017 09/05/2016 05/03/2016  HbA1c - 7.8 7.8 9.4% 10.4 9.8%  Microalbumin mg/L <5.0 - - - -  Micro/Creat Ratio - <13.7 - - - -  Chol <200 mg/dL 131 - - 129 -  HDL >50 mg/dL 36(L) - - 31(L) -  Calc LDL <130 mg/dL - - - 78 -  Triglycerides <150 mg/dL 89 - - 99 -  Creatinine 0.50 - 1.10 mg/dL 0.55 - - 0.57 -   BP/Weight 09/18/2017 05/15/2017 01/16/2017 7/82/4235 02/28/1442  Systolic BP 154 008 676 195 093  Diastolic BP 72 80 80 82 80  Wt. (Lbs) 207.8 200 197.6 204 207.2  BMI 33.54 32.28 31.89 32.93 33.46   Foot/eye exam completion dates Latest Ref Rng & Units 09/18/2017 09/05/2016  Eye Exam No Retinopathy - -  Foot Form Completion - Done Done   a1c 8.8 Heather Sherman  reports that  has never smoked. she has never used smokeless tobacco. She  reports that she does not drink alcohol or use drugs.     Assessment & Plan:  Type 2 diabetes mellitus without complication, without long-term current use of insulin ([place]) - Plan: HgB A1c  Need for vaccination against Streptococcus pneumoniae - Plan: Pneumococcal polysaccharide vaccine 23-valent greater than or equal to 2yo subcutaneous/IM, CANCELED: Pneumococcal conjugate vaccine 13-valent  Screening for breast cancer - Plan: MM Digital Screening  Hypertension associated with diabetes ([place])  Mixed hyperlipidemia due to type 2 diabetes mellitus ([place])  Obesity (BMI 30-39.9)  Type 2 diabetes mellitus with complication, without long-term current use of insulin ([place])  Rx changes: none discussed the possibility of increasing her medications however she would like to work harder on diet and exercise. 1. Education: Reviewed 'ABCs' of diabetes management (respective goals in parentheses):  A1C (<7), blood pressure (<130/80), and cholesterol (LDL <100). 2. Compliance at present is estimated to be poor. Efforts to improve compliance (if necessary) will be directed at increased exercise.  Recommend increasing her exercise to 20 minutes every day.  Follow up: 4 months With her next visit we will do a complete exam including Pap.

## 2018-01-23 DIAGNOSIS — Z23 Encounter for immunization: Secondary | ICD-10-CM | POA: Diagnosis not present

## 2018-02-14 ENCOUNTER — Other Ambulatory Visit: Payer: Self-pay | Admitting: Family Medicine

## 2018-02-14 DIAGNOSIS — Z1231 Encounter for screening mammogram for malignant neoplasm of breast: Secondary | ICD-10-CM

## 2018-04-14 ENCOUNTER — Other Ambulatory Visit: Payer: Self-pay | Admitting: Family Medicine

## 2018-04-14 DIAGNOSIS — Z794 Long term (current) use of insulin: Principal | ICD-10-CM

## 2018-04-14 DIAGNOSIS — E118 Type 2 diabetes mellitus with unspecified complications: Secondary | ICD-10-CM

## 2018-05-10 ENCOUNTER — Encounter: Payer: No Typology Code available for payment source | Admitting: Family Medicine

## 2018-05-15 ENCOUNTER — Other Ambulatory Visit: Payer: Self-pay | Admitting: Family Medicine

## 2018-05-15 DIAGNOSIS — Z794 Long term (current) use of insulin: Principal | ICD-10-CM

## 2018-05-15 DIAGNOSIS — E118 Type 2 diabetes mellitus with unspecified complications: Secondary | ICD-10-CM

## 2018-05-22 ENCOUNTER — Encounter: Payer: No Typology Code available for payment source | Admitting: Family Medicine

## 2018-06-01 ENCOUNTER — Ambulatory Visit (INDEPENDENT_AMBULATORY_CARE_PROVIDER_SITE_OTHER): Payer: 59 | Admitting: Family Medicine

## 2018-06-01 ENCOUNTER — Encounter: Payer: Self-pay | Admitting: Family Medicine

## 2018-06-01 VITALS — BP 124/76 | HR 92 | Temp 97.8°F | Ht 66.0 in | Wt 206.2 lb

## 2018-06-01 DIAGNOSIS — R05 Cough: Secondary | ICD-10-CM

## 2018-06-01 DIAGNOSIS — Z1239 Encounter for other screening for malignant neoplasm of breast: Secondary | ICD-10-CM

## 2018-06-01 DIAGNOSIS — I10 Essential (primary) hypertension: Secondary | ICD-10-CM

## 2018-06-01 DIAGNOSIS — E782 Mixed hyperlipidemia: Secondary | ICD-10-CM

## 2018-06-01 DIAGNOSIS — D509 Iron deficiency anemia, unspecified: Secondary | ICD-10-CM | POA: Diagnosis not present

## 2018-06-01 DIAGNOSIS — Z0000 Encounter for general adult medical examination without abnormal findings: Secondary | ICD-10-CM

## 2018-06-01 DIAGNOSIS — E119 Type 2 diabetes mellitus without complications: Secondary | ICD-10-CM

## 2018-06-01 DIAGNOSIS — T464X5A Adverse effect of angiotensin-converting-enzyme inhibitors, initial encounter: Secondary | ICD-10-CM

## 2018-06-01 DIAGNOSIS — Z1231 Encounter for screening mammogram for malignant neoplasm of breast: Secondary | ICD-10-CM | POA: Diagnosis not present

## 2018-06-01 DIAGNOSIS — E669 Obesity, unspecified: Secondary | ICD-10-CM | POA: Diagnosis not present

## 2018-06-01 DIAGNOSIS — I152 Hypertension secondary to endocrine disorders: Secondary | ICD-10-CM

## 2018-06-01 DIAGNOSIS — E1159 Type 2 diabetes mellitus with other circulatory complications: Secondary | ICD-10-CM | POA: Diagnosis not present

## 2018-06-01 DIAGNOSIS — E1169 Type 2 diabetes mellitus with other specified complication: Secondary | ICD-10-CM | POA: Diagnosis not present

## 2018-06-01 DIAGNOSIS — R058 Other specified cough: Secondary | ICD-10-CM

## 2018-06-01 LAB — POCT URINALYSIS DIP (PROADVANTAGE DEVICE)
BILIRUBIN UA: NEGATIVE
BILIRUBIN UA: NEGATIVE mg/dL
Blood, UA: NEGATIVE
Glucose, UA: NEGATIVE mg/dL
Leukocytes, UA: NEGATIVE
Nitrite, UA: NEGATIVE
PH UA: 6 (ref 5.0–8.0)
Protein Ur, POC: NEGATIVE mg/dL
Specific Gravity, Urine: 1.025
Urobilinogen, Ur: 3.5

## 2018-06-01 LAB — POCT GLYCOSYLATED HEMOGLOBIN (HGB A1C): Hemoglobin A1C: 8.2 % — AB (ref 4.0–5.6)

## 2018-06-01 MED ORDER — IRBESARTAN-HYDROCHLOROTHIAZIDE 150-12.5 MG PO TABS: 1.0000 | ORAL_TABLET | Freq: Every day | ORAL | 3 refills | Status: DC

## 2018-06-01 NOTE — Progress Notes (Signed)
Subjective:    Patient ID: Heather Sherman, female    DOB: 27-Mar-1971, 47 y.o.   MRN: [id number]  HPI She is here for complete exam.  She has not been checking her blood sugars regularly but does walk regularly.  She is also been checking her feet.  She does need an eye exam.  She now states that she has had a cough for well over a year but neglected to mention this to me.  She is due for a Pap but she would like to hold off on that.  She does not smoke or drink.  She continues on metformin as well as glyburide and is having no difficulty with that.  Also she is taking simvastatin without problem.  No longer taking iron supplementation. Family and social history as well as health maintenance and immunizations was reviewed.  There is a strong family history for diabetes.  Review of Systems  All other systems reviewed and are negative.      Objective:   Physical Exam BP 124/76 (BP Location: Left Arm, Patient Position: Sitting)   Pulse 92   Temp 97.8 F (36.6 C)   Ht 5' 6"  (1.676 m)   Wt 206 lb 3.2 oz (93.5 kg)   SpO2 99%   BMI 33.28 kg/m   General Appearance:    Alert, cooperative, no distress, appears stated age  Head:    Normocephalic, without obvious abnormality, atraumatic  Eyes:    PERRL, conjunctiva/corneas clear, EOM's intact, fundi    benign  Ears:    Normal TM's and external ear canals  Nose:   Nares normal, mucosa normal, no drainage or sinus   tenderness  Throat:   Lips, mucosa, and tongue normal; teeth and gums normal  Neck:   Supple, no lymphadenopathy;  thyroid:  no   enlargement/tenderness/nodules; no carotid   bruit or JVD     Lungs:     Clear to auscultation bilaterally without wheezes, rales or     ronchi; respirations unlabored      Heart:    Regular rate and rhythm, S1 and S2 normal, no murmur, rub   or gallop  Breast Exam:    Deferred to GYN  Abdomen:     Soft, non-tender, nondistended, normoactive bowel sounds,    no masses, no hepatosplenomegaly    Genitalia:    Deferred to GYN     Extremities:   No clubbing, cyanosis or edema  Pulses:   2+ and symmetric all extremities  Skin:   Skin color, texture, turgor normal, no rashes or lesions  Lymph nodes:   Cervical, supraclavicular, and axillary nodes normal  Neurologic:   CNII-XII intact, normal strength, sensation and gait; reflexes 2+ and symmetric throughout          Psych:   Normal mood, affect, hygiene and grooming.           Assessment & Plan:  Screening for breast cancer - Plan: MM DIGITAL SCREENING BILATERAL  Type 2 diabetes mellitus without complication, without long-term current use of insulin ([place]) - Plan: Ambulatory referral to Ophthalmology, POCT glycosylated hemoglobin (Hb A1C), POCT Urinalysis DIP (Proadvantage Device)  Hypertension associated with diabetes ([place]) - Plan: irbesartan-hydrochlorothiazide (AVALIDE) 150-12.5 MG tablet  Obesity (BMI 30-39.9)  ACE-inhibitor cough  Iron deficiency anemia, unspecified iron deficiency anemia type  Mixed hyperlipidemia due to type 2 diabetes mellitus ([place])  Routine general medical examination at a health care facility I will do her Pap with her next visit.  We will switch to Avapro.  Again reinforced diet and exercise.  Discussed placing her on another diabetes medicine however she refused.  She will be set up for mammogram as well as ophthalmology. Reinforced the fact that poor care from diabetes will contribute to stroke, blindness, heart disease, kidney failure, neurologic issues.

## 2018-06-15 ENCOUNTER — Other Ambulatory Visit: Payer: Self-pay | Admitting: Family Medicine

## 2018-06-15 DIAGNOSIS — I10 Essential (primary) hypertension: Principal | ICD-10-CM

## 2018-06-15 DIAGNOSIS — E1159 Type 2 diabetes mellitus with other circulatory complications: Secondary | ICD-10-CM

## 2018-06-22 ENCOUNTER — Telehealth: Payer: Self-pay

## 2018-06-22 NOTE — Telephone Encounter (Signed)
Spoke to pt and gave her [facility] number so that she can make her appt since they have not been able to reach her [facility]

## 2018-08-02 ENCOUNTER — Telehealth: Payer: Self-pay

## 2018-08-02 NOTE — Telephone Encounter (Signed)
Pt called and said her pharmacy says that irbesartan was on back order. Pt was advised to call [facility] and see if they have med and if so to have her pharmacy to send over script. [place]

## 2018-08-21 LAB — HM DIABETES EYE EXAM

## 2018-10-03 ENCOUNTER — Ambulatory Visit: Payer: 59 | Admitting: Family Medicine

## 2018-10-08 ENCOUNTER — Ambulatory Visit (INDEPENDENT_AMBULATORY_CARE_PROVIDER_SITE_OTHER): Payer: 59 | Admitting: Family Medicine

## 2018-10-08 ENCOUNTER — Encounter: Payer: Self-pay | Admitting: Family Medicine

## 2018-10-08 ENCOUNTER — Telehealth: Payer: Self-pay | Admitting: Family Medicine

## 2018-10-08 VITALS — BP 120/82 | HR 94 | Temp 97.7°F | Ht 67.0 in | Wt 210.0 lb

## 2018-10-08 DIAGNOSIS — Z23 Encounter for immunization: Secondary | ICD-10-CM

## 2018-10-08 DIAGNOSIS — E782 Mixed hyperlipidemia: Secondary | ICD-10-CM

## 2018-10-08 DIAGNOSIS — I10 Essential (primary) hypertension: Secondary | ICD-10-CM

## 2018-10-08 DIAGNOSIS — Z9119 Patient's noncompliance with other medical treatment and regimen: Secondary | ICD-10-CM

## 2018-10-08 DIAGNOSIS — E119 Type 2 diabetes mellitus without complications: Secondary | ICD-10-CM | POA: Diagnosis not present

## 2018-10-08 DIAGNOSIS — E669 Obesity, unspecified: Secondary | ICD-10-CM | POA: Diagnosis not present

## 2018-10-08 DIAGNOSIS — E1159 Type 2 diabetes mellitus with other circulatory complications: Secondary | ICD-10-CM | POA: Diagnosis not present

## 2018-10-08 DIAGNOSIS — E1169 Type 2 diabetes mellitus with other specified complication: Secondary | ICD-10-CM

## 2018-10-08 DIAGNOSIS — Z91199 Patient's noncompliance with other medical treatment and regimen due to unspecified reason: Secondary | ICD-10-CM

## 2018-10-08 LAB — POCT GLYCOSYLATED HEMOGLOBIN (HGB A1C): Hemoglobin A1C: 9 % — AB (ref 4.0–5.6)

## 2018-10-08 LAB — POCT UA - MICROALBUMIN
Albumin/Creatinine Ratio, Urine, POC: 6.3
Creatinine, POC: 171.2 mg/dL
Microalbumin Ur, POC: 10.8 mg/L

## 2018-10-08 MED ORDER — DULAGLUTIDE 0.75 MG/0.5ML ~~LOC~~ SOAJ: 1.0000 "pen " | SUBCUTANEOUS | 3 refills | Status: DC

## 2018-10-08 NOTE — Patient Instructions (Signed)
20 minutes of something physical every day or 150 minutes a week of something physical. Work on having more frequent but smaller meals and do not skip a meal

## 2018-10-08 NOTE — Telephone Encounter (Signed)
   Patient called Trulicity too expensive $700 +   Please call

## 2018-10-08 NOTE — Progress Notes (Signed)
  Subjective:    Patient ID: Heather Sherman, female    DOB: 1971-09-21, 47 y.o.   MRN: [id number]  Heather Sherman is a 47 y.o. female who presents for follow-up of Type 2 diabetes mellitus.  Patient is checking home blood sugars.   Home blood sugar records: meter record 100-345 How often is blood sugars being checked: once a week  Current symptoms/problems include none and have been unchanged. Daily foot checks: yes   Any foot concerns: none  Last eye exam: 08-21-18 Exercise: walking 15 min 2 days a week  The following portions of the patient's history were reviewed and updated as appropriate: allergies, current medications, past medical history, past social history and problem list.  ROS as in subjective above.     Objective:    Physical Exam Alert and in no distress otherwise not examined.  A1c is 9.0 Lab Review Diabetic Labs Latest Ref Rng & Units 06/01/2018 01/22/2018 09/18/2017 05/15/2017 01/16/2017  HbA1c 4.0 - 5.6 % 8.2(A) 8.8 7.8 7.8 9.4%  Microalbumin mg/L - - <5.0 - -  Micro/Creat Ratio - - - <13.7 - -  Chol <200 mg/dL - - 131 - -  HDL >50 mg/dL - - 36(L) - -  Calc LDL mg/dL (calc) - - 78 - -  Triglycerides <150 mg/dL - - 89 - -  Creatinine 0.50 - 1.10 mg/dL - - 0.55 - -   BP/Weight 06/01/2018 01/22/2018 09/18/2017 05/15/2017 08/05/5725  Systolic BP 203 559 741 638 453  Diastolic BP 76 84 72 80 80  Wt. (Lbs) 206.2 207.4 207.8 200 197.6  BMI 33.28 33.48 33.54 32.28 31.89   Foot/eye exam completion dates Latest Ref Rng & Units 08/21/2018 09/18/2017  Eye Exam No Retinopathy No Retinopathy -  Foot Form Completion - - Done    Heather Sherman  reports that she has never smoked. She has never used smokeless tobacco. She reports that she does not drink alcohol or use drugs.     Assessment & Plan:    Type 2 diabetes mellitus without complication, without long-term current use of insulin ([place]) - Plan: POCT glycosylated hemoglobin (Hb A1C), Dulaglutide (TRULICITY) 6.46 OE/3.2ZY  SOPN, CBC with Differential/Platelet, Comprehensive metabolic panel, Lipid panel, POCT UA - Microalbumin  Need for influenza vaccination - Plan: Flu Vaccine QUAD 6+ mos PF IM (Fluarix Quad PF)  Hypertension associated with diabetes ([place]) - Plan: CBC with Differential/Platelet, Comprehensive metabolic panel  Obesity (BMI 30-39.9)  Mixed hyperlipidemia due to type 2 diabetes mellitus ([place]) - Plan: Lipid panel  Personal history of noncompliance with medical treatment, presenting hazards to health    1. Rx changes: Trulicity will be added to her regimen. 2. Education: Reviewed 'ABCs' of diabetes management (respective goals in parentheses):  A1C (<7), blood pressure (<130/80), and cholesterol (LDL <100). 3. Compliance at present is estimated to be fair. Efforts to improve compliance (if necessary) will be directed at increased exercise.  Recommended 20 minutes of something physical every day or 150 minutes a week.  Also discussed eating more frequent but smaller meals.   4. Follow up: 4 months

## 2018-10-09 LAB — CBC WITH DIFFERENTIAL/PLATELET
BASOS ABS: 0.1 10*3/uL (ref 0.0–0.2)
Basos: 1 %
EOS (ABSOLUTE): 0.2 10*3/uL (ref 0.0–0.4)
Eos: 2 %
Hematocrit: 35.7 % (ref 34.0–46.6)
Hemoglobin: 11.9 g/dL (ref 11.1–15.9)
IMMATURE GRANULOCYTES: 0 %
Immature Grans (Abs): 0 10*3/uL (ref 0.0–0.1)
Lymphocytes Absolute: 2.6 10*3/uL (ref 0.7–3.1)
Lymphs: 33 %
MCH: 27.4 pg (ref 26.6–33.0)
MCHC: 33.3 g/dL (ref 31.5–35.7)
MCV: 82 fL (ref 79–97)
MONOS ABS: 0.6 10*3/uL (ref 0.1–0.9)
Monocytes: 8 %
NEUTROS PCT: 56 %
Neutrophils Absolute: 4.4 10*3/uL (ref 1.4–7.0)
PLATELETS: 360 10*3/uL (ref 150–450)
RBC: 4.34 x10E6/uL (ref 3.77–5.28)
RDW: 13 % (ref 12.3–15.4)
WBC: 7.9 10*3/uL (ref 3.4–10.8)

## 2018-10-09 LAB — COMPREHENSIVE METABOLIC PANEL
ALT: 18 IU/L (ref 0–32)
AST: 12 IU/L (ref 0–40)
Albumin/Globulin Ratio: 1.5 (ref 1.2–2.2)
Albumin: 4.7 g/dL (ref 3.5–5.5)
Alkaline Phosphatase: 49 IU/L (ref 39–117)
BUN / CREAT RATIO: 19 (ref 9–23)
BUN: 13 mg/dL (ref 6–24)
Bilirubin Total: <0.2 mg/dL (ref 0.0–1.2)
CALCIUM: 10.7 mg/dL — AB (ref 8.7–10.2)
CHLORIDE: 97 mmol/L (ref 96–106)
CO2: 23 mmol/L (ref 20–29)
Creatinine, Ser: 0.69 mg/dL (ref 0.57–1.00)
GFR calc Af Amer: 121 mL/min/{1.73_m2} (ref >59)
GFR calc non Af Amer: 105 mL/min/{1.73_m2} (ref >59)
Globulin, Total: 3.1 g/dL (ref 1.5–4.5)
Glucose: 100 mg/dL — ABNORMAL HIGH (ref 65–99)
POTASSIUM: 4.2 mmol/L (ref 3.5–5.2)
Sodium: 138 mmol/L (ref 134–144)
Total Protein: 7.8 g/dL (ref 6.0–8.5)

## 2018-10-09 LAB — LIPID PANEL
CHOLESTEROL TOTAL: 138 mg/dL (ref 100–199)
Chol/HDL Ratio: 3.5 ratio (ref 0.0–4.4)
HDL: 40 mg/dL (ref >39)
LDL Calculated: 74 mg/dL (ref 0–99)
Triglycerides: 121 mg/dL (ref 0–149)
VLDL Cholesterol Cal: 24 mg/dL (ref 5–40)

## 2018-10-09 NOTE — Telephone Encounter (Signed)
Obviously her insurance is not covering that.  I asked her to have her check with the pharmacy to see which will be covered.  Work with her on this

## 2018-10-10 NOTE — Telephone Encounter (Signed)
Pt was advsied to present saving card if it is still to high to call her insurance company to see what they will cover in trulicity place. Pt says she will call back if she needs a new script for different med. [place]

## 2018-10-11 ENCOUNTER — Other Ambulatory Visit: Payer: Self-pay | Admitting: Family Medicine

## 2018-10-11 DIAGNOSIS — Z794 Long term (current) use of insulin: Principal | ICD-10-CM

## 2018-10-11 DIAGNOSIS — E118 Type 2 diabetes mellitus with unspecified complications: Secondary | ICD-10-CM

## 2018-10-12 ENCOUNTER — Other Ambulatory Visit: Payer: Self-pay | Admitting: Family Medicine

## 2018-10-12 DIAGNOSIS — E1169 Type 2 diabetes mellitus with other specified complication: Secondary | ICD-10-CM

## 2018-10-12 DIAGNOSIS — E782 Mixed hyperlipidemia: Principal | ICD-10-CM

## 2018-11-07 ENCOUNTER — Other Ambulatory Visit: Payer: Self-pay | Admitting: Family Medicine

## 2018-11-07 DIAGNOSIS — Z794 Long term (current) use of insulin: Principal | ICD-10-CM

## 2018-11-07 DIAGNOSIS — E118 Type 2 diabetes mellitus with unspecified complications: Secondary | ICD-10-CM

## 2019-02-18 ENCOUNTER — Ambulatory Visit: Payer: 59 | Admitting: Family Medicine

## 2019-02-25 ENCOUNTER — Encounter: Payer: Self-pay | Admitting: Family Medicine

## 2019-02-25 ENCOUNTER — Ambulatory Visit (INDEPENDENT_AMBULATORY_CARE_PROVIDER_SITE_OTHER): Payer: 59 | Admitting: Family Medicine

## 2019-02-25 VITALS — BP 128/86 | HR 86 | Temp 98.5°F | Wt 210.4 lb

## 2019-02-25 DIAGNOSIS — E119 Type 2 diabetes mellitus without complications: Secondary | ICD-10-CM

## 2019-02-25 DIAGNOSIS — R058 Other specified cough: Secondary | ICD-10-CM

## 2019-02-25 DIAGNOSIS — E1169 Type 2 diabetes mellitus with other specified complication: Secondary | ICD-10-CM | POA: Diagnosis not present

## 2019-02-25 DIAGNOSIS — E669 Obesity, unspecified: Secondary | ICD-10-CM | POA: Diagnosis not present

## 2019-02-25 DIAGNOSIS — E782 Mixed hyperlipidemia: Secondary | ICD-10-CM

## 2019-02-25 DIAGNOSIS — E1159 Type 2 diabetes mellitus with other circulatory complications: Secondary | ICD-10-CM | POA: Diagnosis not present

## 2019-02-25 DIAGNOSIS — I152 Hypertension secondary to endocrine disorders: Secondary | ICD-10-CM

## 2019-02-25 DIAGNOSIS — R05 Cough: Secondary | ICD-10-CM

## 2019-02-25 DIAGNOSIS — Z9119 Patient's noncompliance with other medical treatment and regimen: Secondary | ICD-10-CM

## 2019-02-25 DIAGNOSIS — Z91199 Patient's noncompliance with other medical treatment and regimen due to unspecified reason: Secondary | ICD-10-CM

## 2019-02-25 DIAGNOSIS — T464X5A Adverse effect of angiotensin-converting-enzyme inhibitors, initial encounter: Secondary | ICD-10-CM

## 2019-02-25 DIAGNOSIS — I10 Essential (primary) hypertension: Secondary | ICD-10-CM

## 2019-02-25 LAB — POCT GLYCOSYLATED HEMOGLOBIN (HGB A1C): Hemoglobin A1C: 9.4 % — AB (ref 4.0–5.6)

## 2019-02-25 MED ORDER — ERTUGLIFLOZIN L-PYROGLUTAMICAC 15 MG PO TABS: 1.0000 | ORAL_TABLET | Freq: Every day | ORAL | 5 refills | Status: DC

## 2019-02-25 MED ORDER — PIOGLITAZONE HCL 30 MG PO TABS: 30.0000 mg | ORAL_TABLET | Freq: Every day | ORAL | 5 refills | Status: DC

## 2019-02-25 NOTE — Addendum Note (Signed)
Addended by: Elyse Jarvis on: 02/25/2019 11:16 AM   Modules accepted: Orders

## 2019-02-25 NOTE — Patient Instructions (Signed)
20 minutes of something physical every day or 150 minutes a week of something physical.

## 2019-02-25 NOTE — Progress Notes (Signed)
  Subjective:    Patient ID: Heather Sherman, female    DOB: 02-12-1971, 48 y.o.   MRN: [id number]  Heather Sherman is a 48 y.o. female who presents for follow-up of Type 2 diabetes mellitus.  Patient is  checking home blood sugars.   Home blood sugar records: meter record How often is blood sugars being checked: Qd fasting 115-197  Current symptoms/problems include none at this time. Daily foot checks: yes   Any foot concerns: none Last eye exam: 08/21/18 Exercise: walking on weekends She continues on metformin as well as glyburide and having no difficulty with them.  She could not afford to take the Trulicity.  She continues on simvastatin as well as irbesartan/hctz The following portions of the patient's history were reviewed and updated as appropriate: allergies, current medications, past medical history, past social history and problem list.  ROS as in subjective above.     Objective:    Physical Exam Alert and in no distress otherwise not examined. A1c 9.4 Lab Review Diabetic Labs Latest Ref Rng & Units 10/08/2018 06/01/2018 01/22/2018 09/18/2017 05/15/2017  HbA1c 4.0 - 5.6 % 9.0(A) 8.2(A) 8.8 7.8 7.8  Microalbumin mg/L 10.8 - - <5.0 -  Micro/Creat Ratio - 6.3 - - <13.7 -  Chol 100 - 199 mg/dL 138 - - 131 -  HDL >39 mg/dL 40 - - 36(L) -  Calc LDL 0 - 99 mg/dL 74 - - 78 -  Triglycerides 0 - 149 mg/dL 121 - - 89 -  Creatinine 0.57 - 1.00 mg/dL 0.69 - - 0.55 -   BP/Weight 10/08/2018 06/01/2018 01/22/2018 09/18/2017 0/14/1030  Systolic BP 131 438 887 579 728  Diastolic BP 82 76 84 72 80  Wt. (Lbs) 210 206.2 207.4 207.8 200  BMI 32.89 33.28 33.48 33.54 32.28   Foot/eye exam completion dates Latest Ref Rng & Units 08/21/2018 09/18/2017  Eye Exam No Retinopathy No Retinopathy -  Foot Form Completion - - Done    Heather Sherman  reports that she has never smoked. She has never used smokeless tobacco. She reports that she does not drink alcohol or use drugs.     Assessment & Plan:      Type 2 diabetes mellitus without complication, without long-term current use of insulin (HCC) - Plan: Ertugliflozin L-PyroglutamicAc (STEGLATRO) 15 MG TABS, pioglitazone (ACTOS) 30 MG tablet  Hypertension associated with diabetes ([place])  Obesity (BMI 30-39.9)  Mixed hyperlipidemia due to type 2 diabetes mellitus (HCC)  ACE-inhibitor cough  Personal history of noncompliance with medical treatment, presenting hazards to health   1. Rx changes: Actos and Steglatro.  Explained that if the cost is too much for the [facility], call back. 2. Education: Reviewed 'ABCs' of diabetes management (respective goals in parentheses):  A1C (<7), blood pressure (<130/80), and cholesterol (LDL <100). 3. Compliance at present is estimated to be poor. Efforts to improve compliance (if necessary) will be directed at increased exercise. 4. Follow up: 4 months I again stated the need that she needs to exercise more and make dietary changes that she so far has been unable or unwilling to make.  Also discussed the possibility of using insulin.

## 2019-04-04 ENCOUNTER — Other Ambulatory Visit: Payer: Self-pay | Admitting: Family Medicine

## 2019-04-04 DIAGNOSIS — E118 Type 2 diabetes mellitus with unspecified complications: Secondary | ICD-10-CM

## 2019-04-04 DIAGNOSIS — Z794 Long term (current) use of insulin: Principal | ICD-10-CM

## 2019-06-05 ENCOUNTER — Other Ambulatory Visit: Payer: Self-pay | Admitting: Family Medicine

## 2019-06-05 DIAGNOSIS — E1159 Type 2 diabetes mellitus with other circulatory complications: Secondary | ICD-10-CM

## 2019-06-28 ENCOUNTER — Other Ambulatory Visit: Payer: Self-pay | Admitting: Family Medicine

## 2019-06-28 DIAGNOSIS — E118 Type 2 diabetes mellitus with unspecified complications: Secondary | ICD-10-CM

## 2019-06-29 ENCOUNTER — Other Ambulatory Visit: Payer: Self-pay | Admitting: Family Medicine

## 2019-06-29 DIAGNOSIS — Z794 Long term (current) use of insulin: Secondary | ICD-10-CM

## 2019-06-29 DIAGNOSIS — E118 Type 2 diabetes mellitus with unspecified complications: Secondary | ICD-10-CM

## 2019-07-01 ENCOUNTER — Encounter: Payer: Self-pay | Admitting: Family Medicine

## 2019-07-01 ENCOUNTER — Other Ambulatory Visit: Payer: Self-pay

## 2019-07-01 ENCOUNTER — Ambulatory Visit (INDEPENDENT_AMBULATORY_CARE_PROVIDER_SITE_OTHER): Payer: 59 | Admitting: Family Medicine

## 2019-07-01 VITALS — BP 128/80 | HR 90 | Temp 98.8°F | Wt 199.8 lb

## 2019-07-01 DIAGNOSIS — E118 Type 2 diabetes mellitus with unspecified complications: Secondary | ICD-10-CM

## 2019-07-01 DIAGNOSIS — E1159 Type 2 diabetes mellitus with other circulatory complications: Secondary | ICD-10-CM

## 2019-07-01 DIAGNOSIS — I10 Essential (primary) hypertension: Secondary | ICD-10-CM

## 2019-07-01 DIAGNOSIS — E669 Obesity, unspecified: Secondary | ICD-10-CM

## 2019-07-01 DIAGNOSIS — Z794 Long term (current) use of insulin: Secondary | ICD-10-CM

## 2019-07-01 DIAGNOSIS — E119 Type 2 diabetes mellitus without complications: Secondary | ICD-10-CM

## 2019-07-01 DIAGNOSIS — E1169 Type 2 diabetes mellitus with other specified complication: Secondary | ICD-10-CM

## 2019-07-01 DIAGNOSIS — E782 Mixed hyperlipidemia: Secondary | ICD-10-CM

## 2019-07-01 DIAGNOSIS — D509 Iron deficiency anemia, unspecified: Secondary | ICD-10-CM

## 2019-07-01 LAB — POCT GLYCOSYLATED HEMOGLOBIN (HGB A1C): Hemoglobin A1C: 8.6 % — AB (ref 4.0–5.6)

## 2019-07-01 MED ORDER — METFORMIN HCL 1000 MG PO TABS: 1000.0000 mg | ORAL_TABLET | Freq: Two times a day (BID) | ORAL | 5 refills | Status: DC

## 2019-07-01 MED ORDER — SIMVASTATIN 40 MG PO TABS: ORAL_TABLET | ORAL | 11 refills | Status: DC

## 2019-07-01 MED ORDER — GLYBURIDE 5 MG PO TABS: ORAL_TABLET | ORAL | 5 refills | Status: DC

## 2019-07-01 MED ORDER — PIOGLITAZONE HCL 30 MG PO TABS: 30.0000 mg | ORAL_TABLET | Freq: Every day | ORAL | 5 refills | Status: DC

## 2019-07-01 MED ORDER — IRBESARTAN-HYDROCHLOROTHIAZIDE 150-12.5 MG PO TABS: 1.0000 | ORAL_TABLET | Freq: Every day | ORAL | 11 refills | Status: DC

## 2019-07-01 MED ORDER — EXENATIDE ER 2 MG ~~LOC~~ PEN: 1.0000 "application " | PEN_INJECTOR | SUBCUTANEOUS | 5 refills | Status: DC

## 2019-07-01 NOTE — Addendum Note (Signed)
Addended by: Elyse Jarvis on: 07/01/2019 08:53 AM   Modules accepted: Orders

## 2019-07-01 NOTE — Progress Notes (Signed)
  Subjective:    Patient ID: Heather Sherman, female    DOB: 07-15-1971, 48 y.o.   MRN: [id number]  Heather Sherman is a 48 y.o. female who presents for follow-up of Type 2 diabetes mellitus.  Patient is checking home blood sugars.   Home blood sugar records: meter records How often is blood sugars being checked: QOD 130-265 fasting post meal 2 hours Current symptoms/problems include none at this time. Daily foot checks: yes  Any foot concerns: none Last eye exam: 08/21/18 Exercise: walking for 20 min.  She is working extra hours and due to the COVID virus and has lost some weight.  She continues on glipizide, metformin, Actos, Zocor as well as Avalide.  She is having no difficulty with any of them.  She did have trouble getting Catering manager and Trulicity covered by her insurance.  The following portions of the patient's history were reviewed and updated as appropriate: allergies, current medications, past medical history, past social history and problem list.  ROS as in subjective above.     Objective:    Physical Exam Alert and in no distress otherwise not examined.   Lab Review Diabetic Labs Latest Ref Rng & Units 02/25/2019 10/08/2018 06/01/2018 01/22/2018 09/18/2017  HbA1c 4.0 - 5.6 % 9.4(A) 9.0(A) 8.2(A) 8.8 7.8  Microalbumin mg/L - 10.8 - - <5.0  Micro/Creat Ratio - - 6.3 - - <13.7  Chol 100 - 199 mg/dL - 138 - - 131  HDL >39 mg/dL - 40 - - 36(L)  Calc LDL 0 - 99 mg/dL - 74 - - 78  Triglycerides 0 - 149 mg/dL - 121 - - 89  Creatinine 0.57 - 1.00 mg/dL - 0.69 - - 0.55   BP/Weight 02/25/2019 10/08/2018 06/01/2018 01/22/2018 5/85/2778  Systolic BP 242 353 614 431 540  Diastolic BP 86 82 76 84 72  Wt. (Lbs) 210.4 210 206.2 207.4 207.8  BMI 32.95 32.89 33.28 33.48 33.54   Foot/eye exam completion dates Latest Ref Rng & Units 08/21/2018 09/18/2017  Eye Exam No Retinopathy No Retinopathy -  Foot Form Completion - - Done  Hemoglobin A1c 8.6  [name]  reports that she has never  smoked. She has never used smokeless tobacco. She reports that she does not drink alcohol or use drugs.    Assessment & Plan:    Encounter Diagnoses  Name Primary?  . Type 2 diabetes with complication (HCC) Yes  . Obesity (BMI 30-39.9)   . Mixed hyperlipidemia due to type 2 diabetes mellitus ([place])   . Hypertension associated with diabetes ([place])   . Iron deficiency anemia, unspecified iron deficiency anemia type   . Type 2 diabetes mellitus without complication, without long-term current use of insulin ([place])   . Type 2 diabetes mellitus with complication, with long-term current use of insulin ([place])     1. Rx changes: I will call in Bydureon.  She is to check to make sure her insurance covers this and if not to check with the insurance company to see which drug from this class will be covered. 2. Education: Reviewed 'ABCs' of diabetes management (respective goals in parentheses):  A1C (<7), blood pressure (<130/80), and cholesterol (LDL <100). 3. Compliance at present is estimated to be fair. Efforts to improve compliance (if necessary) will be directed at increased exercise. 4. Follow up: 4 months All of her medications were renewed for 30 days

## 2019-11-07 ENCOUNTER — Encounter: Payer: Self-pay | Admitting: Family Medicine

## 2019-11-07 ENCOUNTER — Ambulatory Visit (INDEPENDENT_AMBULATORY_CARE_PROVIDER_SITE_OTHER): Payer: 59 | Admitting: Family Medicine

## 2019-11-07 ENCOUNTER — Other Ambulatory Visit: Payer: Self-pay

## 2019-11-07 VITALS — BP 134/84 | HR 98 | Temp 97.8°F | Wt 199.0 lb

## 2019-11-07 DIAGNOSIS — E785 Hyperlipidemia, unspecified: Secondary | ICD-10-CM

## 2019-11-07 DIAGNOSIS — E669 Obesity, unspecified: Secondary | ICD-10-CM | POA: Diagnosis not present

## 2019-11-07 DIAGNOSIS — Z794 Long term (current) use of insulin: Secondary | ICD-10-CM

## 2019-11-07 DIAGNOSIS — E1159 Type 2 diabetes mellitus with other circulatory complications: Secondary | ICD-10-CM | POA: Diagnosis not present

## 2019-11-07 DIAGNOSIS — I10 Essential (primary) hypertension: Secondary | ICD-10-CM

## 2019-11-07 DIAGNOSIS — Z23 Encounter for immunization: Secondary | ICD-10-CM | POA: Diagnosis not present

## 2019-11-07 DIAGNOSIS — E782 Mixed hyperlipidemia: Secondary | ICD-10-CM

## 2019-11-07 DIAGNOSIS — E1169 Type 2 diabetes mellitus with other specified complication: Secondary | ICD-10-CM

## 2019-11-07 DIAGNOSIS — E118 Type 2 diabetes mellitus with unspecified complications: Secondary | ICD-10-CM | POA: Diagnosis not present

## 2019-11-07 DIAGNOSIS — E119 Type 2 diabetes mellitus without complications: Secondary | ICD-10-CM

## 2019-11-07 DIAGNOSIS — D509 Iron deficiency anemia, unspecified: Secondary | ICD-10-CM

## 2019-11-07 LAB — CBC WITH DIFFERENTIAL/PLATELET
Basophils Absolute: 0.1 10*3/uL (ref 0.0–0.2)
Basos: 1 %
EOS (ABSOLUTE): 0.2 10*3/uL (ref 0.0–0.4)
Eos: 3 %
Hematocrit: 36.2 % (ref 34.0–46.6)
Hemoglobin: 12 g/dL (ref 11.1–15.9)
Immature Grans (Abs): 0 10*3/uL (ref 0.0–0.1)
Immature Granulocytes: 0 %
Lymphocytes Absolute: 2 10*3/uL (ref 0.7–3.1)
Lymphs: 34 %
MCH: 26.9 pg (ref 26.6–33.0)
MCHC: 33.1 g/dL (ref 31.5–35.7)
MCV: 81 fL (ref 79–97)
Monocytes Absolute: 0.7 10*3/uL (ref 0.1–0.9)
Monocytes: 11 %
Neutrophils Absolute: 3 10*3/uL (ref 1.4–7.0)
Neutrophils: 51 %
Platelets: 339 10*3/uL (ref 150–450)
RBC: 4.46 x10E6/uL (ref 3.77–5.28)
RDW: 12.3 % (ref 11.7–15.4)
WBC: 5.9 10*3/uL (ref 3.4–10.8)

## 2019-11-07 LAB — COMPREHENSIVE METABOLIC PANEL
ALT: 17 IU/L (ref 0–32)
AST: 17 IU/L (ref 0–40)
Albumin/Globulin Ratio: 1.4 (ref 1.2–2.2)
Albumin: 4.6 g/dL (ref 3.8–4.8)
Alkaline Phosphatase: 52 IU/L (ref 39–117)
BUN/Creatinine Ratio: 16 (ref 9–23)
BUN: 9 mg/dL (ref 6–24)
Bilirubin Total: 0.3 mg/dL (ref 0.0–1.2)
CO2: 23 mmol/L (ref 20–29)
Calcium: 9.8 mg/dL (ref 8.7–10.2)
Chloride: 97 mmol/L (ref 96–106)
Creatinine, Ser: 0.57 mg/dL (ref 0.57–1.00)
GFR calc Af Amer: 128 mL/min/{1.73_m2} (ref >59)
GFR calc non Af Amer: 111 mL/min/{1.73_m2} (ref >59)
Globulin, Total: 3.2 g/dL (ref 1.5–4.5)
Glucose: 148 mg/dL — ABNORMAL HIGH (ref 65–99)
Potassium: 4.2 mmol/L (ref 3.5–5.2)
Sodium: 137 mmol/L (ref 134–144)
Total Protein: 7.8 g/dL (ref 6.0–8.5)

## 2019-11-07 LAB — LIPID PANEL
Chol/HDL Ratio: 3.3 ratio (ref 0.0–4.4)
Cholesterol, Total: 120 mg/dL (ref 100–199)
HDL: 36 mg/dL — ABNORMAL LOW (ref >39)
LDL Chol Calc (NIH): 66 mg/dL (ref 0–99)
Triglycerides: 96 mg/dL (ref 0–149)
VLDL Cholesterol Cal: 18 mg/dL (ref 5–40)

## 2019-11-07 LAB — POCT GLYCOSYLATED HEMOGLOBIN (HGB A1C): Hemoglobin A1C: 8.1 % — AB (ref 4.0–5.6)

## 2019-11-07 MED ORDER — GLYBURIDE 5 MG PO TABS: ORAL_TABLET | ORAL | 1 refills | Status: DC

## 2019-11-07 MED ORDER — IRBESARTAN-HYDROCHLOROTHIAZIDE 150-12.5 MG PO TABS: 1.0000 | ORAL_TABLET | Freq: Every day | ORAL | 3 refills | Status: DC

## 2019-11-07 MED ORDER — METFORMIN HCL 1000 MG PO TABS: 1000.0000 mg | ORAL_TABLET | Freq: Two times a day (BID) | ORAL | 1 refills | Status: DC

## 2019-11-07 MED ORDER — PIOGLITAZONE HCL 30 MG PO TABS: 30.0000 mg | ORAL_TABLET | Freq: Every day | ORAL | 1 refills | Status: DC

## 2019-11-07 MED ORDER — SIMVASTATIN 40 MG PO TABS: ORAL_TABLET | ORAL | 3 refills | Status: DC

## 2019-11-07 NOTE — Patient Instructions (Signed)
Start walking on your breaks and get 20 minutes extra physical activity daily

## 2019-11-07 NOTE — Progress Notes (Signed)
Subjective:    Patient ID: Heather Sherman, female    DOB: 1971-11-30, 48 y.o.   MRN: [id number]  Heather Sherman is a 48 y.o. female who presents for follow-up of Type 2 diabetes mellitus.  Home blood sugar records: fasting range: 125 ;298 pc Current symptoms/problems include hyperglycemia and have been unchanged. Daily foot checks:   Any foot concerns: no Exercise: The patient does not participate in regular exercise at present.  She usually walks once per week. Diet:regular She continues on metformin, Actos and glyburide.  She has tried to get insulin in the past but the cheapest price was $300.  She continues on simvastatin and having no aches or pains with that.  Also taking Avalide and doing well with this medication.  Presently not taking any iron supplementation. The following portions of the patient's history were reviewed and updated as appropriate: allergies, current medications, past medical history, past social history and problem list.  ROS as in subjective above.     Objective:    Physical Exam Alert and in no distress foot exam is negative.  Hemoglobin A1c is 8.1  Blood pressure 134/84, pulse 98, temperature 97.8 F (36.6 C), weight 199 lb (90.3 kg).  Lab Review Diabetic Labs Latest Ref Rng & Units 07/01/2019 02/25/2019 10/08/2018 06/01/2018 01/22/2018  HbA1c 4.0 - 5.6 % 8.6(A) 9.4(A) 9.0(A) 8.2(A) 8.8  Microalbumin mg/L - - 10.8 - -  Micro/Creat Ratio - - - 6.3 - -  Chol 100 - 199 mg/dL - - 138 - -  HDL >39 mg/dL - - 40 - -  Calc LDL 0 - 99 mg/dL - - 74 - -  Triglycerides 0 - 149 mg/dL - - 121 - -  Creatinine 0.57 - 1.00 mg/dL - - 0.69 - -   BP/Weight 11/07/2019 07/01/2019 02/25/2019 38/88/2800 02/26/9178  Systolic BP 150 569 794 801 655  Diastolic BP 84 80 86 82 76  Wt. (Lbs) 199 199.8 210.4 210 206.2  BMI 31.17 31.29 32.95 32.89 33.28   Foot/eye exam completion dates Latest Ref Rng & Units 08/21/2018 09/18/2017  Eye Exam No Retinopathy No Retinopathy -  Foot  Form Completion - - Done    Delberta  reports that she has never smoked. She has never used smokeless tobacco. She reports that she does not drink alcohol or use drugs.     Assessment & Plan:    Type 2 diabetes with complication (HCC) - Plan: CBC with Differential, Comprehensive metabolic panel, Lipid panel, POCT UA - Microalbumin  Need for pneumococcal vaccination - Plan: Pneumococcal conjugate vaccine 13-valent  Obesity (BMI 30-39.9) - Plan: CBC with Differential, Comprehensive metabolic panel, Lipid panel  Hypertension associated with diabetes ([place]) - Plan: CBC with Differential, Comprehensive metabolic panel, irbesartan-hydrochlorothiazide (AVALIDE) 150-12.5 MG tablet  Hyperlipidemia associated with type 2 diabetes mellitus (HCC) - Plan: Lipid panel  Iron deficiency anemia, unspecified iron deficiency anemia type - Plan: CBC with Differential  Mixed hyperlipidemia due to type 2 diabetes mellitus (HCC) - Plan: simvastatin (ZOCOR) 40 MG tablet  Type 2 diabetes mellitus without complication, without long-term current use of insulin (HCC) - Plan: pioglitazone (ACTOS) 30 MG tablet  Type 2 diabetes mellitus with complication, with long-term current use of insulin (HCC) - Plan: metFORMIN (GLUCOPHAGE) 1000 MG tablet, glyBURIDE (DIABETA) 5 MG tablet    1. Rx changes: none 2. Education: Reviewed 'ABCs' of diabetes management (respective goals in parentheses):  A1C (<7), blood pressure (<130/80), and cholesterol (LDL <100). 3. Compliance at present is  estimated to be good. Efforts to improve compliance (if necessary) will be directed at increased exercise.  Recommend she get 20 minutes of extra physical activity during her breaks at work. 4. Follow up: 4 months

## 2020-03-10 ENCOUNTER — Ambulatory Visit: Payer: 59 | Admitting: Family Medicine

## 2020-03-23 ENCOUNTER — Ambulatory Visit (INDEPENDENT_AMBULATORY_CARE_PROVIDER_SITE_OTHER): Payer: 59 | Admitting: Family Medicine

## 2020-03-23 ENCOUNTER — Other Ambulatory Visit: Payer: Self-pay

## 2020-03-23 ENCOUNTER — Encounter: Payer: Self-pay | Admitting: Family Medicine

## 2020-03-23 VITALS — BP 122/74 | HR 78 | Temp 97.3°F | Wt 201.6 lb

## 2020-03-23 DIAGNOSIS — E1159 Type 2 diabetes mellitus with other circulatory complications: Secondary | ICD-10-CM

## 2020-03-23 DIAGNOSIS — Z1231 Encounter for screening mammogram for malignant neoplasm of breast: Secondary | ICD-10-CM

## 2020-03-23 DIAGNOSIS — E669 Obesity, unspecified: Secondary | ICD-10-CM

## 2020-03-23 DIAGNOSIS — Z794 Long term (current) use of insulin: Secondary | ICD-10-CM

## 2020-03-23 DIAGNOSIS — Z1211 Encounter for screening for malignant neoplasm of colon: Secondary | ICD-10-CM | POA: Diagnosis not present

## 2020-03-23 DIAGNOSIS — E785 Hyperlipidemia, unspecified: Secondary | ICD-10-CM

## 2020-03-23 DIAGNOSIS — E118 Type 2 diabetes mellitus with unspecified complications: Secondary | ICD-10-CM

## 2020-03-23 DIAGNOSIS — E119 Type 2 diabetes mellitus without complications: Secondary | ICD-10-CM

## 2020-03-23 DIAGNOSIS — I10 Essential (primary) hypertension: Secondary | ICD-10-CM

## 2020-03-23 DIAGNOSIS — E1169 Type 2 diabetes mellitus with other specified complication: Secondary | ICD-10-CM

## 2020-03-23 LAB — POCT GLYCOSYLATED HEMOGLOBIN (HGB A1C): Hemoglobin A1C: 8.1 % — AB (ref 4.0–5.6)

## 2020-03-23 LAB — POCT UA - MICROALBUMIN
Albumin/Creatinine Ratio, Urine, POC: <6.1
Creatinine, POC: 81.6 mg/dL
Microalbumin Ur, POC: <5 mg/L

## 2020-03-23 MED ORDER — GLYBURIDE 5 MG PO TABS: ORAL_TABLET | ORAL | 1 refills | Status: DC

## 2020-03-23 MED ORDER — PIOGLITAZONE HCL 30 MG PO TABS: 30.0000 mg | ORAL_TABLET | Freq: Every day | ORAL | 1 refills | Status: DC

## 2020-03-23 NOTE — Progress Notes (Signed)
  Subjective:    Patient ID: Heather Sherman, female    DOB: 1971-08-12, 49 y.o.   MRN: [id number]  Heather Sherman is a 49 y.o. female who presents for follow-up of Type 2 diabetes mellitus.  Home blood sugar records: meter record 2 hours post meal 110-335 Current symptoms/problems include none at this time . Daily foot checks:yes   Any foot concerns: none Exercise: 30-1 hour weekly walking.  Works as a Training and development officer Diet: needs improvement  She recognizes the need for an eye exam and will set that up.  She has not had a mammogram or Pap in several years.  She continues on glyburide, Actos and Metformin.  A GLP-1 was recommended however she could not afford this.  She is having no difficulty with simvastatin causing aches or pains and is tolerating the Avalide without trouble. The following portions of the patient's history were reviewed and updated as appropriate: allergies, current medications, past medical history, past social history and problem list.  ROS as in subjective above.     Objective:    Physical Exam Alert and in no distress otherwise not examined.  Hemoglobin A1c is 8.1 Lab Review Diabetic Labs Latest Ref Rng & Units 11/07/2019 07/01/2019 02/25/2019 10/08/2018 06/01/2018  HbA1c 4.0 - 5.6 % 8.1(A) 8.6(A) 9.4(A) 9.0(A) 8.2(A)  Microalbumin mg/L - - - 10.8 -  Micro/Creat Ratio - - - - 6.3 -  Chol 100 - 199 mg/dL 120 - - 138 -  HDL >39 mg/dL 36(L) - - 40 -  Calc LDL 0 - 99 mg/dL 66 - - 74 -  Triglycerides 0 - 149 mg/dL 96 - - 121 -  Creatinine 0.57 - 1.00 mg/dL 0.57 - - 0.69 -   BP/Weight 11/07/2019 07/01/2019 02/25/2019 97/67/3419 03/02/9023  Systolic BP 097 353 299 242 683  Diastolic BP 84 80 86 82 76  Wt. (Lbs) 199 199.8 210.4 210 206.2  BMI 31.17 31.29 32.95 32.89 33.28   Foot/eye exam completion dates Latest Ref Rng & Units 11/07/2019 08/21/2018  Eye Exam No Retinopathy - No Retinopathy  Foot Form Completion - Done -    Alicyn  reports that she has never smoked. She has  never used smokeless tobacco. She reports that she does not drink alcohol or use drugs.     Assessment & Plan:    Type 2 diabetes with complication (HCC) - Plan: POCT glycosylated hemoglobin (Hb A1C), POCT UA - Microalbumin  Screening for colon cancer - Plan: Cologuard  Encounter for screening mammogram for malignant neoplasm of breast - Plan: MM DIGITAL SCREENING BILATERAL  Obesity (BMI 30-39.9)  Hypertension associated with diabetes ([place])  Hyperlipidemia associated with type 2 diabetes mellitus ([place])  Type 2 diabetes mellitus with complication, with long-term current use of insulin ([place]) - Plan: glyBURIDE (DIABETA) 5 MG tablet  Type 2 diabetes mellitus without complication, without long-term current use of insulin ([place]) - Plan: pioglitazone (ACTOS) 30 MG tablet   1. Rx changes: none 2. Education: Reviewed 'ABCs' of diabetes management (respective goals in parentheses):  A1C (<7), blood pressure (<130/80), and cholesterol (LDL <100).  Difficult for her to really increase her physical activity 3. Compliance at present is estimated to be fair. Efforts to improve compliance (if necessary) will be directed at dietary modifications: Cut back on carbohydrates. 4. Follow up: 4 months She will be set up for a Pap with her next visit.

## 2020-05-06 ENCOUNTER — Telehealth: Payer: Self-pay

## 2020-05-06 NOTE — Telephone Encounter (Signed)
Pt. Called to let us know that the next time she needs refills on her meds it needs to be sent to [facility] and has to be a 90 day supply per her insurance company. I added the new pharmacy to her preferred pharmacy list.

## 2020-05-07 NOTE — Telephone Encounter (Signed)
Confirmed in pt chart. Heather Sherman

## 2020-05-13 LAB — COLOGUARD: Cologuard: NEGATIVE

## 2020-05-22 NOTE — Progress Notes (Signed)
Pt was advised of negative cologuard. [place]

## 2020-07-01 ENCOUNTER — Other Ambulatory Visit: Payer: Self-pay | Admitting: Family Medicine

## 2020-07-01 DIAGNOSIS — E118 Type 2 diabetes mellitus with unspecified complications: Secondary | ICD-10-CM

## 2020-07-23 ENCOUNTER — Encounter: Payer: 59 | Admitting: Family Medicine

## 2020-08-14 ENCOUNTER — Encounter: Payer: 59 | Admitting: Family Medicine

## 2020-09-04 ENCOUNTER — Encounter: Payer: Self-pay | Admitting: Family Medicine

## 2020-09-04 ENCOUNTER — Ambulatory Visit (INDEPENDENT_AMBULATORY_CARE_PROVIDER_SITE_OTHER): Payer: 59 | Admitting: Family Medicine

## 2020-09-04 VITALS — BP 124/80 | HR 83 | Temp 98.6°F | Wt 200.6 lb

## 2020-09-04 DIAGNOSIS — E669 Obesity, unspecified: Secondary | ICD-10-CM | POA: Diagnosis not present

## 2020-09-04 DIAGNOSIS — I152 Hypertension secondary to endocrine disorders: Secondary | ICD-10-CM

## 2020-09-04 DIAGNOSIS — E785 Hyperlipidemia, unspecified: Secondary | ICD-10-CM

## 2020-09-04 DIAGNOSIS — E1159 Type 2 diabetes mellitus with other circulatory complications: Secondary | ICD-10-CM

## 2020-09-04 DIAGNOSIS — Z23 Encounter for immunization: Secondary | ICD-10-CM

## 2020-09-04 DIAGNOSIS — E1169 Type 2 diabetes mellitus with other specified complication: Secondary | ICD-10-CM

## 2020-09-04 DIAGNOSIS — I10 Essential (primary) hypertension: Secondary | ICD-10-CM

## 2020-09-04 DIAGNOSIS — E118 Type 2 diabetes mellitus with unspecified complications: Secondary | ICD-10-CM

## 2020-09-04 LAB — POCT GLYCOSYLATED HEMOGLOBIN (HGB A1C): Hemoglobin A1C: 8.8 % — AB (ref 4.0–5.6)

## 2020-09-04 MED ORDER — SITAGLIPTIN PHOSPHATE 100 MG PO TABS: 100.0000 mg | ORAL_TABLET | Freq: Every day | ORAL | 5 refills | Status: DC

## 2020-09-04 NOTE — Progress Notes (Signed)
  Subjective:    Patient ID: Heather Sherman, female    DOB: [birth year], 49 y.o.   MRN: [id number]  Heather Sherman is a 49 y.o. female who presents for follow-up of Type 2 diabetes mellitus.  Home blood sugar records: meter records, fasting and post meal , 170-200 Current symptoms/problems include none at his time. Daily foot checks: yes   Any foot concerns: none  Exercise: walking usually twice per week. Diet:fair Her insurance will not cover any of the GLP-1 medications.  She continues on Metformin, glyburide and Actos.  She is taking simvastatin without aches or pains.  Continues 0n Avalide without difficulty. The following portions of the patient's history were reviewed and updated as appropriate: allergies, current medications, past medical history, past social history and problem list.  ROS as in subjective above.     Objective:    Physical Exam Alert and in no distress otherwise not examined.  Hemoglobin A1c is 8.8  Lab Review Diabetic Labs Latest Ref Rng & Units 09/04/2020 03/23/2020 11/07/2019 07/01/2019 02/25/2019  HbA1c 4.0 - 5.6 % 8.8(A) 8.1(A) 8.1(A) 8.6(A) 9.4(A)  Microalbumin mg/L - <5.0 - - -  Micro/Creat Ratio - - <6.1 - - -  Chol 100 - 199 mg/dL - - 120 - -  HDL >39 mg/dL - - 36(L) - -  Calc LDL 0 - 99 mg/dL - - 66 - -  Triglycerides 0 - 149 mg/dL - - 96 - -  Creatinine 0.57 - 1.00 mg/dL - - 0.57 - -   BP/Weight 09/04/2020 03/23/2020 11/07/2019 01/31/2034 05/04/7415  Systolic BP 384 536 468 032 122  Diastolic BP 80 74 84 80 86  Wt. (Lbs) 200.6 201.6 199 199.8 210.4  BMI 31.42 31.58 31.17 31.29 32.95   Foot/eye exam completion dates Latest Ref Rng & Units 11/07/2019 08/21/2018  Eye Exam No Retinopathy - No Retinopathy  Foot Form Completion - Done -    Heather Sherman  reports that she has never smoked. She has never used smokeless tobacco. She reports that she does not drink alcohol and does not use drugs.     Assessment & Plan:     Type 2 diabetes with  complication (HCC) - Plan: POCT glycosylated hemoglobin (Hb A1C)  Need for influenza vaccination  Obesity (BMI 30-39.9)  Hypertension associated with diabetes ([place])  Hyperlipidemia associated with type 2 diabetes mellitus ([place])   1. Rx changes: Januvia 2. Education: Reviewed 'ABCs' of diabetes management (respective goals in parentheses):  A1C (<7), blood pressure (<130/80), and cholesterol (LDL <100). 3. Compliance at present is estimated to be poor. Efforts to improve compliance (if necessary) will be directed at increased exercise.  Recommend that she take 5 minutes on her morning break, 10 at lunch and 5 in the afternoon for increased physical activity. 4. Follow up: 4 months I explained that I am running out of options of medications to give that are not expensive.  She will check on the Januvia and I might be able to add it to another medication.  We need to see if we can get her medication through the drug companies if we need to.

## 2020-11-26 ENCOUNTER — Other Ambulatory Visit: Payer: Self-pay | Admitting: Family Medicine

## 2020-11-26 DIAGNOSIS — E1159 Type 2 diabetes mellitus with other circulatory complications: Secondary | ICD-10-CM

## 2020-12-03 ENCOUNTER — Other Ambulatory Visit: Payer: Self-pay | Admitting: Family Medicine

## 2020-12-03 DIAGNOSIS — E1169 Type 2 diabetes mellitus with other specified complication: Secondary | ICD-10-CM

## 2020-12-29 ENCOUNTER — Other Ambulatory Visit: Payer: Self-pay | Admitting: Family Medicine

## 2020-12-29 DIAGNOSIS — E118 Type 2 diabetes mellitus with unspecified complications: Secondary | ICD-10-CM

## 2021-01-18 ENCOUNTER — Ambulatory Visit: Payer: 59 | Admitting: Family Medicine

## 2021-01-25 ENCOUNTER — Encounter: Payer: Self-pay | Admitting: Family Medicine

## 2021-01-25 ENCOUNTER — Ambulatory Visit: Payer: 59 | Admitting: Family Medicine

## 2021-01-25 ENCOUNTER — Other Ambulatory Visit: Payer: Self-pay

## 2021-01-25 VITALS — BP 124/76 | HR 96 | Temp 98.3°F | Wt 195.4 lb

## 2021-01-25 DIAGNOSIS — E785 Hyperlipidemia, unspecified: Secondary | ICD-10-CM

## 2021-01-25 DIAGNOSIS — E118 Type 2 diabetes mellitus with unspecified complications: Secondary | ICD-10-CM

## 2021-01-25 DIAGNOSIS — E669 Obesity, unspecified: Secondary | ICD-10-CM

## 2021-01-25 DIAGNOSIS — I152 Hypertension secondary to endocrine disorders: Secondary | ICD-10-CM

## 2021-01-25 DIAGNOSIS — E1169 Type 2 diabetes mellitus with other specified complication: Secondary | ICD-10-CM | POA: Diagnosis not present

## 2021-01-25 DIAGNOSIS — E1159 Type 2 diabetes mellitus with other circulatory complications: Secondary | ICD-10-CM

## 2021-01-25 LAB — POCT GLYCOSYLATED HEMOGLOBIN (HGB A1C): Hemoglobin A1C: 7.5 % — AB (ref 4.0–5.6)

## 2021-01-25 LAB — POCT UA - MICROALBUMIN
Albumin/Creatinine Ratio, Urine, POC: 5.3
Creatinine, POC: 118.7 mg/dL
Microalbumin Ur, POC: 6.3 mg/L

## 2021-01-25 NOTE — Progress Notes (Signed)
  Subjective:    Patient ID: Heather Sherman, female    DOB: 02/05/1971, 50 y.o.   MRN: [id number]  Heather Sherman is a 50 y.o. female who presents for follow-up of Type 2 diabetes mellitus.  Home blood sugar records: meter records, fasting, 128-300 Current symptoms/problems include blood sugar readings. Also numbness and tingling in hands and  feet. Daily foot checks: yes   Any foot concerns: none  Exercise: staying active. She has much more active now due to increased in her workload. Apparently one of her coworkers was fired due to not wanting to get the vaccine for Covid. Diet: fair She continues on glyburide, Metformin, Januvia and Actos. She is having no difficulties with him. Heather Sherman is on simvastatin and Avalide for her blood pressure. She has no particular concerns or complaints. Smoking and drinking were reviewed. The following portions of the patient's history were reviewed and updated as appropriate: allergies, current medications, past medical history, past social history and problem list.  ROS as in subjective above.     Objective:    Physical Exam Alert and in no distress. Foot exam is recorded and is normal. Hemoglobin A1c is 7.5   Lab Review Diabetic Labs Latest Ref Rng & Units 09/04/2020 03/23/2020 11/07/2019 07/01/2019 02/25/2019  HbA1c 4.0 - 5.6 % 8.8(A) 8.1(A) 8.1(A) 8.6(A) 9.4(A)  Microalbumin mg/L - <5.0 - - -  Micro/Creat Ratio - - <6.1 - - -  Chol 100 - 199 mg/dL - - 120 - -  HDL >39 mg/dL - - 36(L) - -  Calc LDL 0 - 99 mg/dL - - 66 - -  Triglycerides 0 - 149 mg/dL - - 96 - -  Creatinine 0.57 - 1.00 mg/dL - - 0.57 - -   BP/Weight 09/04/2020 03/23/2020 11/07/2019 01/28/3434 06/03/6167  Systolic BP 372 902 111 552 080  Diastolic BP 80 74 84 80 86  Wt. (Lbs) 200.6 201.6 199 199.8 210.4  BMI 31.42 31.58 31.17 31.29 32.95   Foot/eye exam completion dates Latest Ref Rng & Units 11/07/2019 08/21/2018  Eye Exam No Retinopathy - No Retinopathy  Foot Form Completion -  Done -    Heather Sherman  reports that she has never smoked. She has never used smokeless tobacco. She reports that she does not drink alcohol and does not use drugs.     Assessment & Plan:    Type 2 diabetes with complication (HCC) - Plan: CBC with Differential/Platelet, Comprehensive metabolic panel, Lipid panel, POCT UA - Microalbumin, POCT glycosylated hemoglobin (Hb A1C), Ambulatory referral to Ophthalmology  Obesity (BMI 30-39.9)  Hypertension associated with diabetes ([name]) - Plan: CBC with Differential/Platelet, Comprehensive metabolic panel  Hyperlipidemia associated with type 2 diabetes mellitus ([name]) - Plan: Lipid panel   1. Rx changes: none 2. Education: Reviewed 'ABCs' of diabetes management (respective goals in parentheses):  A1C (<7), blood pressure (<130/80), and cholesterol (LDL <100). 3. Compliance at present is estimated to be good. Efforts to improve compliance (if necessary) will be directed at Continue with present diet and exercise. 4. Follow up: 4 months also recommend in 4 months that she set up for complete exam including Pap and pelvic. 5. I congratulated her on her hemoglobin A1c and her weight loss.

## 2021-01-26 LAB — CBC WITH DIFFERENTIAL/PLATELET
Basophils Absolute: 0.1 10*3/uL (ref 0.0–0.2)
Basos: 1 %
EOS (ABSOLUTE): 0.1 10*3/uL (ref 0.0–0.4)
Eos: 2 %
Hematocrit: 34.9 % (ref 34.0–46.6)
Hemoglobin: 11.6 g/dL (ref 11.1–15.9)
Immature Grans (Abs): 0 10*3/uL (ref 0.0–0.1)
Immature Granulocytes: 0 %
Lymphocytes Absolute: 1.8 10*3/uL (ref 0.7–3.1)
Lymphs: 24 %
MCH: 27.3 pg (ref 26.6–33.0)
MCHC: 33.2 g/dL (ref 31.5–35.7)
MCV: 82 fL (ref 79–97)
Monocytes Absolute: 0.5 10*3/uL (ref 0.1–0.9)
Monocytes: 7 %
Neutrophils Absolute: 5.1 10*3/uL (ref 1.4–7.0)
Neutrophils: 66 %
Platelets: 369 10*3/uL (ref 150–450)
RBC: 4.25 x10E6/uL (ref 3.77–5.28)
RDW: 12.8 % (ref 11.7–15.4)
WBC: 7.6 10*3/uL (ref 3.4–10.8)

## 2021-01-26 LAB — COMPREHENSIVE METABOLIC PANEL
ALT: 10 IU/L (ref 0–32)
AST: 17 IU/L (ref 0–40)
Albumin/Globulin Ratio: 1.3 (ref 1.2–2.2)
Albumin: 4.2 g/dL (ref 3.8–4.8)
Alkaline Phosphatase: 50 IU/L (ref 44–121)
BUN/Creatinine Ratio: 13 (ref 9–23)
BUN: 9 mg/dL (ref 6–24)
Bilirubin Total: 0.2 mg/dL (ref 0.0–1.2)
CO2: 23 mmol/L (ref 20–29)
Calcium: 9.7 mg/dL (ref 8.7–10.2)
Chloride: 100 mmol/L (ref 96–106)
Creatinine, Ser: 0.7 mg/dL (ref 0.57–1.00)
GFR calc Af Amer: 118 mL/min/{1.73_m2} (ref >59)
GFR calc non Af Amer: 102 mL/min/{1.73_m2} (ref >59)
Globulin, Total: 3.2 g/dL (ref 1.5–4.5)
Glucose: 208 mg/dL — ABNORMAL HIGH (ref 65–99)
Potassium: 4.1 mmol/L (ref 3.5–5.2)
Sodium: 139 mmol/L (ref 134–144)
Total Protein: 7.4 g/dL (ref 6.0–8.5)

## 2021-01-26 LAB — LIPID PANEL
Chol/HDL Ratio: 3.5 ratio (ref 0.0–4.4)
Cholesterol, Total: 116 mg/dL (ref 100–199)
HDL: 33 mg/dL — ABNORMAL LOW (ref >39)
LDL Chol Calc (NIH): 64 mg/dL (ref 0–99)
Triglycerides: 98 mg/dL (ref 0–149)
VLDL Cholesterol Cal: 19 mg/dL (ref 5–40)

## 2021-03-01 ENCOUNTER — Other Ambulatory Visit: Payer: Self-pay | Admitting: Family Medicine

## 2021-05-03 ENCOUNTER — Other Ambulatory Visit: Payer: Self-pay | Admitting: Family Medicine

## 2021-05-03 DIAGNOSIS — E118 Type 2 diabetes mellitus with unspecified complications: Secondary | ICD-10-CM

## 2021-05-03 DIAGNOSIS — Z794 Long term (current) use of insulin: Secondary | ICD-10-CM

## 2021-05-26 ENCOUNTER — Encounter: Payer: 59 | Admitting: Family Medicine

## 2021-06-01 ENCOUNTER — Ambulatory Visit: Payer: 59 | Admitting: Family Medicine

## 2021-06-01 ENCOUNTER — Other Ambulatory Visit: Payer: Self-pay

## 2021-06-01 VITALS — BP 110/80 | HR 79 | Wt 197.0 lb

## 2021-06-01 DIAGNOSIS — I152 Hypertension secondary to endocrine disorders: Secondary | ICD-10-CM

## 2021-06-01 DIAGNOSIS — E1169 Type 2 diabetes mellitus with other specified complication: Secondary | ICD-10-CM

## 2021-06-01 DIAGNOSIS — E118 Type 2 diabetes mellitus with unspecified complications: Secondary | ICD-10-CM

## 2021-06-01 DIAGNOSIS — E1159 Type 2 diabetes mellitus with other circulatory complications: Secondary | ICD-10-CM

## 2021-06-01 DIAGNOSIS — E669 Obesity, unspecified: Secondary | ICD-10-CM | POA: Diagnosis not present

## 2021-06-01 DIAGNOSIS — E785 Hyperlipidemia, unspecified: Secondary | ICD-10-CM

## 2021-06-01 LAB — POCT GLYCOSYLATED HEMOGLOBIN (HGB A1C): Hemoglobin A1C: 7.3 % — AB (ref 4.0–5.6)

## 2021-06-01 NOTE — Progress Notes (Signed)
  Subjective:    Patient ID: Heather Sherman, female    DOB: 09-23-1971, 50 y.o.   MRN: [id number]  Heather Sherman is a 50 y.o. female who presents for follow-up of Type 2 diabetes mellitus.  Home blood sugar records: am- 143's, pm- 257 Current symptoms/problems include none and have been stable. Daily foot checks: yes   Any foot concerns: numbness and tingling Exercise: walking Diet:regular She continues on glyburide, Januvia, metformin and Actos.  She seems to doing well on that.  She is taking simvastatin without difficulty and also taking Avalide without difficulty. The following portions of the patient's history were reviewed and updated as appropriate: allergies, current medications, past medical history, past social history and problem list.  ROS as in subjective above.     Objective:    Physical Exam Alert and in no distress otherwise not examined.  Lab Review Diabetic Labs Latest Ref Rng & Units 06/01/2021 01/25/2021 09/04/2020 03/23/2020 11/07/2019  HbA1c 4.0 - 5.6 % 7.3(A) 7.5(A) 8.8(A) 8.1(A) 8.1(A)  Microalbumin mg/L - 6.3 - <5.0 -  Micro/Creat Ratio - - 5.3 - <6.1 -  Chol 100 - 199 mg/dL - 116 - - 120  HDL >39 mg/dL - 33(L) - - 36(L)  Calc LDL 0 - 99 mg/dL - 64 - - 66  Triglycerides 0 - 149 mg/dL - 98 - - 96  Creatinine 0.57 - 1.00 mg/dL - 0.70 - - 0.57   BP/Weight 06/01/2021 01/25/2021 09/04/2020 03/23/2020 23/34/3568  Systolic BP 616 837 290 211 155  Diastolic BP 80 76 80 74 84  Wt. (Lbs) 197 195.4 200.6 201.6 199  BMI 30.85 30.6 31.42 31.58 31.17   Foot/eye exam completion dates Latest Ref Rng & Units 01/25/2021 11/07/2019  Eye Exam No Retinopathy - -  Foot Form Completion - Done Done  Hemoglobin A1c is 7.3  Malin  reports that she has never smoked. She has never used smokeless tobacco. She reports that she does not drink alcohol and does not use drugs.     Assessment & Plan:    Type 2 diabetes with complication ([place]) - Plan: HgB A1c  Obesity (BMI  30-39.9)  Hypertension associated with diabetes ([place])  Hyperlipidemia associated with type 2 diabetes mellitus ([place])   1. Rx changes: none 2. Education: Reviewed 'ABCs' of diabetes management (respective goals in parentheses):  A1C (<7), blood pressure (<130/80), and cholesterol (LDL <100). 3. Compliance at present is estimated to be good. Efforts to improve compliance (if necessary) will be directed at increased exercise. 4. Follow up: 4 months

## 2021-06-30 ENCOUNTER — Other Ambulatory Visit: Payer: Self-pay | Admitting: Family Medicine

## 2021-06-30 DIAGNOSIS — Z794 Long term (current) use of insulin: Secondary | ICD-10-CM

## 2021-07-19 ENCOUNTER — Other Ambulatory Visit: Payer: Self-pay | Admitting: Family Medicine

## 2021-07-19 DIAGNOSIS — E118 Type 2 diabetes mellitus with unspecified complications: Secondary | ICD-10-CM

## 2021-07-22 ENCOUNTER — Telehealth: Payer: Self-pay

## 2021-07-22 NOTE — Telephone Encounter (Signed)
Pt states having issues with Metformin [facility] ref[facility].   [facility]ed [facility] and [facility] didn't get the [facility] so I called it in and called pt and informed

## 2021-08-25 ENCOUNTER — Other Ambulatory Visit: Payer: Self-pay | Admitting: Family Medicine

## 2021-08-25 DIAGNOSIS — E782 Mixed hyperlipidemia: Secondary | ICD-10-CM

## 2021-09-02 ENCOUNTER — Emergency Department (HOSPITAL_COMMUNITY)
Admission: EM | Admit: 2021-09-02 | Discharge: 2021-09-03 | Disposition: A | Discharge status: Home / Self Care | Payer: 59 | Attending: Emergency Medicine | Admitting: Emergency Medicine

## 2021-09-02 ENCOUNTER — Other Ambulatory Visit: Payer: Self-pay

## 2021-09-02 DIAGNOSIS — I10 Essential (primary) hypertension: Secondary | ICD-10-CM | POA: Diagnosis not present

## 2021-09-02 DIAGNOSIS — E1169 Type 2 diabetes mellitus with other specified complication: Secondary | ICD-10-CM | POA: Diagnosis not present

## 2021-09-02 DIAGNOSIS — Z79899 Other long term (current) drug therapy: Secondary | ICD-10-CM | POA: Diagnosis not present

## 2021-09-02 DIAGNOSIS — Z7984 Long term (current) use of oral hypoglycemic drugs: Secondary | ICD-10-CM | POA: Insufficient documentation

## 2021-09-02 DIAGNOSIS — E785 Hyperlipidemia, unspecified: Secondary | ICD-10-CM | POA: Diagnosis not present

## 2021-09-02 DIAGNOSIS — R519 Headache, unspecified: Secondary | ICD-10-CM | POA: Insufficient documentation

## 2021-09-02 MED ORDER — ACETAMINOPHEN 500 MG PO TABS
1000.0000 mg | ORAL_TABLET | Freq: Once | ORAL | Status: AC
  Administered 2021-09-02: 1000 mg via ORAL
  Filled 2021-09-02: qty 2

## 2021-09-02 NOTE — ED Provider Notes (Signed)
Emergency Medicine Provider Triage Evaluation Note  Heather Sherman , a 50 y.o. female  was evaluated in triage.  Pt complains of headache since yesterday. Pain started gradually on right side of head and throbbing, worsening today. Tried aleve and tylenol without relief.   Review of Systems  Positive: headache Negative: Fevers, chills, nausea, vomiting, CP, SOB  Physical Exam  BP (!) 150/67 (BP Location: Left Arm)   Pulse 97   Temp 98.4 F (36.9 C)   Resp 16   Ht 5' 7"  (1.702 m)   Wt 90.7 kg   SpO2 100%   BMI 31.32 kg/m  Gen:   Awake, no distress   Resp:  Normal effort  MSK:   Moves extremities without difficulty  Other:    Medical Decision Making  Medically screening exam initiated at 7:03 PM.  Appropriate orders placed.  Heather Sherman was informed that the remainder of the evaluation will be completed by another provider, this initial triage assessment does not replace that evaluation, and the importance of remaining in the ED until their evaluation is complete.     Estill Cotta 09/02/21 Darlin Drop    Charlesetta Shanks, MD 09/02/21 2352

## 2021-09-02 NOTE — ED Triage Notes (Signed)
C/o headache started yesterday and worst today, worst today. Unable to get relief from OTC meds.

## 2021-09-03 ENCOUNTER — Telehealth: Payer: 59 | Admitting: Family Medicine

## 2021-09-03 ENCOUNTER — Emergency Department (HOSPITAL_COMMUNITY): Payer: 59

## 2021-09-03 MED ORDER — METHYLPREDNISOLONE SODIUM SUCC 125 MG IJ SOLR
125.0000 mg | Freq: Once | INTRAMUSCULAR | Status: AC
  Administered 2021-09-03: 125 mg via INTRAVENOUS
  Filled 2021-09-03: qty 2

## 2021-09-03 MED ORDER — PROCHLORPERAZINE EDISYLATE 10 MG/2ML IJ SOLN
10.0000 mg | Freq: Once | INTRAMUSCULAR | Status: AC
  Administered 2021-09-03: 10 mg via INTRAVENOUS
  Filled 2021-09-03: qty 2

## 2021-09-03 MED ORDER — KETOROLAC TROMETHAMINE 30 MG/ML IJ SOLN
30.0000 mg | Freq: Once | INTRAMUSCULAR | Status: AC
  Administered 2021-09-03: 30 mg via INTRAVENOUS
  Filled 2021-09-03: qty 1

## 2021-09-03 MED ORDER — DIPHENHYDRAMINE HCL 50 MG/ML IJ SOLN
25.0000 mg | Freq: Once | INTRAMUSCULAR | Status: AC
  Administered 2021-09-03: 25 mg via INTRAVENOUS
  Filled 2021-09-03: qty 1

## 2021-09-03 MED ORDER — SODIUM CHLORIDE 0.9 % IV BOLUS
500.0000 mL | Freq: Once | INTRAVENOUS | Status: AC
  Administered 2021-09-03: 500 mL via INTRAVENOUS

## 2021-09-03 NOTE — ED Provider Notes (Signed)
[facility] EMERGENCY DEPARTMENT Provider Note   CSN: [id number] Arrival date & time: 09/02/21  1839     History Chief Complaint  Patient presents with   Headache    Heather Sherman is a 50 y.o. female.  2 days of dull generalized headache. Started frontal. No fever. No trauma. No h/o same. Tried naproxen and extra strength tylenol without relief.  Does not normally have these type of headaches however have become more frequent since being here.  She has identified any obvious lifestyle changes for it yet.    Headache     Past Medical History:  Diagnosis Date   Diabetes mellitus 04/2007   AODM   Hyperlipidemia    Obesity     Patient Active Problem List   Diagnosis Date Noted   Hyperlipidemia associated with type 2 diabetes mellitus ([place]) 11/07/2019   Type 2 diabetes with complication ([place]) 85/88/5027   Obesity (BMI 30-39.9) 02/22/2012   Hypertension associated with diabetes ([place]) 02/22/2012   Anemia 08/22/2011    No past surgical history on file.   OB History   No obstetric history on file.     Family History  Problem Relation Age of Onset   Diabetes Mother    Heart disease Mother    Hypertension Mother    Diabetes Father    Cancer Father    Diabetes Sister     Social History   Tobacco Use   Smoking status: Never   Smokeless tobacco: Never  Substance Use Topics   Alcohol use: No   Drug use: No    Home Medications Prior to Admission medications   Medication Sig Start Date End Date Taking? Authorizing Provider  ferrous sulfate 325 (65 FE) MG tablet Take 1 tablet (325 mg total) by mouth daily with breakfast. 05/03/16   Denita Lung, MD  glucose blood (ONE TOUCH ULTRA TEST) test strip USE TO TEST BLOOD SUGAR TWICE DAILY 05/03/16   Denita Lung, MD  glyBURIDE (DIABETA) 5 MG tablet TAKE 1 TABLET BY MOUTH EVERY DAY WITH BREAKFAST.(30DS/RETAIL,90DS/MAIL) 07/19/21   Denita Lung, MD  irbesartan-hydrochlorothiazide (AVALIDE)  150-12.5 MG tablet TAKE 1 TABLET BY MOUTH EVERY DAY 11/26/20   Denita Lung, MD  JANUVIA 100 MG tablet TAKE 1 TABLET BY MOUTH EVERY DAY 08/25/21   Tysinger, Camelia Eng, PA-C  metFORMIN (GLUCOPHAGE) 1000 MG tablet TAKE 1 TABLET (1,000 MG TOTAL) BY MOUTH 2 (TWO) TIMES DAILY WITH A MEAL. 06/30/21   Denita Lung, MD  Multiple Vitamins-Calcium (ONE-A-DAY WITHIN PO) Take 1 tablet by mouth.    [provider]  pioglitazone (ACTOS) 30 MG tablet Take 1 tablet (30 mg total) by mouth daily. 03/23/20   Denita Lung, MD  simvastatin (ZOCOR) 40 MG tablet TAKE 1 TABLET BY MOUTH EVERYDAY AT BEDTIME 08/25/21   Tysinger, Camelia Eng, PA-C    Allergies    Patient has no known allergies.  Review of Systems   Review of Systems  Neurological:  Positive for headaches.  All other systems reviewed and are negative.  Physical Exam Updated Vital Signs BP 133/71   Pulse 83   Temp 98.4 F (36.9 C) (Oral)   Resp 14   Ht 5' 7"  (1.702 m)   Wt 90.7 kg   SpO2 100%   BMI 31.32 kg/m   Physical Exam Vitals and nursing note reviewed.  Constitutional:      Appearance: She is well-developed.  HENT:     Head: Normocephalic and  atraumatic.     Nose: Nose normal.  Eyes:     General: No visual field deficit.    Extraocular Movements: Extraocular movements intact.     Pupils: Pupils are equal, round, and reactive to light.  Cardiovascular:     Rate and Rhythm: Normal rate and regular rhythm.  Pulmonary:     Effort: No respiratory distress.     Breath sounds: No stridor.  Abdominal:     General: Abdomen is flat. There is no distension.  Musculoskeletal:        General: No swelling or tenderness. Normal range of motion.     Cervical back: Normal range of motion.  Skin:    General: Skin is warm and dry.  Neurological:     Mental Status: She is alert and oriented to person, place, and time.     GCS: GCS eye subscore is 4. GCS verbal subscore is 5. GCS motor subscore is 6.     Cranial Nerves: No cranial  nerve deficit, dysarthria or facial asymmetry.     Sensory: No sensory deficit.     Motor: No weakness.     Coordination: Romberg sign negative. Coordination normal.     Gait: Gait normal.     Deep Tendon Reflexes: Reflexes normal.    ED Results / Procedures / Treatments   Labs (all labs ordered are listed, but only abnormal results are displayed) Labs Reviewed - No data to display  EKG None  Radiology CT HEAD WO CONTRAST (5MM)  Result Date: 09/03/2021 CLINICAL DATA:  Dizziness, headache EXAM: CT HEAD WITHOUT CONTRAST TECHNIQUE: Contiguous axial images were obtained from the base of the skull through the vertex without intravenous contrast. COMPARISON:  None. FINDINGS: Brain: No acute intracranial abnormality. Specifically, no hemorrhage, hydrocephalus, mass lesion, acute infarction, or significant intracranial injury. Vascular: No hyperdense vessel or unexpected calcification. Skull: No acute calvarial abnormality. Sinuses/Orbits: No acute findings Other: None IMPRESSION: No acute intracranial abnormality. Electronically Signed   By: Rolm Baptise M.D.   On: 09/03/2021 03:19    Procedures Procedures   Medications Ordered in ED Medications  acetaminophen (TYLENOL) tablet 1,000 mg (1,000 mg Oral Given 09/02/21 1919)  prochlorperazine (COMPAZINE) injection 10 mg (10 mg Intravenous Given 09/03/21 0242)  diphenhydrAMINE (BENADRYL) injection 25 mg (25 mg Intravenous Given 09/03/21 0239)  methylPREDNISolone sodium succinate (SOLU-MEDROL) 125 mg/2 mL injection 125 mg (125 mg Intravenous Given 09/03/21 0240)  sodium chloride 0.9 % bolus 500 mL (0 mLs Intravenous Stopped 09/03/21 0351)  ketorolac (TORADOL) 30 MG/ML injection 30 mg (30 mg Intravenous Given 09/03/21 0241)    ED Course  I have reviewed the triage vital signs and the nursing notes.  Pertinent labs & imaging results that were available during my care of the patient were reviewed by me and considered in my medical decision making (see chart  for details).    MDM Rules/Calculators/A&P                         Will treat for headache and ct for new/worsening headaches.  Ct ok. Headache better. Will refer to neurology for same.   Final Clinical Impression(s) / ED Diagnoses Final diagnoses:  Acute nonintractable headache, unspecified headache type    Rx / DC Orders ED Discharge Orders          Ordered    Ambulatory referral to Neurology       Comments: An appointment is requested in approximately: 2 weeks  09/03/21 0344             Jerika Wales, Corene Cornea, MD 09/03/21 [phone]

## 2021-09-04 NOTE — Progress Notes (Signed)
0

## 2021-10-20 ENCOUNTER — Encounter: Payer: Self-pay | Admitting: Neurology

## 2021-10-20 ENCOUNTER — Other Ambulatory Visit: Payer: Self-pay | Admitting: Medical

## 2021-10-20 ENCOUNTER — Other Ambulatory Visit: Payer: Self-pay | Admitting: Family Medicine

## 2021-10-20 ENCOUNTER — Ambulatory Visit: Payer: 59 | Admitting: Neurology

## 2021-10-20 VITALS — BP 123/73 | HR 90 | Ht 67.0 in | Wt 204.0 lb

## 2021-10-20 DIAGNOSIS — G44209 Tension-type headache, unspecified, not intractable: Secondary | ICD-10-CM

## 2021-10-20 DIAGNOSIS — E118 Type 2 diabetes mellitus with unspecified complications: Secondary | ICD-10-CM

## 2021-10-20 DIAGNOSIS — E782 Mixed hyperlipidemia: Secondary | ICD-10-CM

## 2021-10-20 MED ORDER — NAPROXEN 500 MG PO TABS: 500.0000 mg | ORAL_TABLET | Freq: Two times a day (BID) | ORAL | 0 refills | Status: AC | PRN

## 2021-10-20 MED ORDER — TIZANIDINE HCL 4 MG PO TABS
4.0000 mg | ORAL_TABLET | Freq: Three times a day (TID) | ORAL | 0 refills | Status: AC | PRN
Start: 2021-10-20 — End: ?

## 2021-10-20 NOTE — Progress Notes (Signed)
[facility]  PATIENT: Heather Sherman DOB: 1971-07-15  REFERRING CLINICIAN: Mesner, Corene Cornea, MD HISTORY FROM: Patient  REASON FOR VISIT: Headaches    HISTORICAL  CHIEF COMPLAINT:  Chief Complaint  Patient presents with   New Patient (Initial Visit)    RM 10, alone. Internal referral for headaches. Denies any vision problems. Wears glasses. Headaches are new for her. Started to be more severe April 2022. Pain located on right side of temple. Vision not affected.  No nausea.     HISTORY OF PRESENT ILLNESS:  This is a 50 year old woman with past medical history of diabetes mellitus type 2, hypertension, hyperlipidemia who is presenting with complaint of headaches.  Patient stated for the past year she has she has been having episodes of headache lasting 3 to 4 days afterward she will be headache free for the next 2 months.  The last episode happened in September where she had a headache for a continuous of 7 days.  During this time she described the pain as aching on the frontal and right side of the head, no associated migrainous features, no auras.  She presented to the ED for worsening headache after taking Tylenol and ibuprofen with no relief, she was given Toradol shot and reported that the pain subsided after couple days.  Has not had any recurrent headaches since then.  She denies any family history of headaches, denies any previous headaches.  Currently she is not taking any preventive medication for headaches.   Headache History and Characteristics: Onset: Since April will have episode of 4 days of headaches then resolved until the next month or 2  Location: Frontal and right side  Quality: Aching  Intensity: 5, can go up 10/10.  Duration: last about 7 days  Migrainous Features: No  Aura: No  History of brain injury or tumor: No  Family history: No  Motion sickness: no Cardiac history: no  OTC: tylenol, codeine Caffeine: Minimal  Sleep: not good,  5 to 6 hours at best, will wake up and cant go back to sleep  Mood/ Stress: 6/10, son is autistic and he is aggressive, lots of stress because sometimes he likes to fight   Prior prophylaxis: Propranolol: No  Verapamil:No TCA: No Topamax: No Depakote: No Effexor: No Cymbalta: No Neurontin:No  Prior abortives: Triptan: No Anti-emetic: No Steroids: No Ergotamine suppository: No   OTHER MEDICAL CONDITIONS: DMII, HLD, HTN,    REVIEW OF SYSTEMS: Full 14 system review of systems performed and negative with exception of: as noted in the HPI  ALLERGIES: No Known Allergies  HOME MEDICATIONS: Outpatient Medications Prior to Visit  Medication Sig Dispense Refill   ferrous sulfate 325 (65 FE) MG tablet Take 1 tablet (325 mg total) by mouth daily with breakfast. 90 tablet 3   glucose blood (ONE TOUCH ULTRA TEST) test strip USE TO TEST BLOOD SUGAR TWICE DAILY 100 each 5   glyBURIDE (DIABETA) 5 MG tablet TAKE 1 TABLET BY MOUTH EVERY DAY WITH BREAKFAST. 30 tablet 1   irbesartan-hydrochlorothiazide (AVALIDE) 150-12.5 MG tablet TAKE 1 TABLET BY MOUTH EVERY DAY 30 tablet 11   JANUVIA 100 MG tablet TAKE 1 TABLET BY MOUTH EVERY DAY 30 tablet 1   metFORMIN (GLUCOPHAGE) 1000 MG tablet TAKE 1 TABLET (1,000 MG TOTAL) BY MOUTH 2 (TWO) TIMES DAILY WITH A MEAL. 180 tablet 1   Multiple Vitamins-Calcium (ONE-A-DAY WITHIN PO) Take 1 tablet by mouth.     pioglitazone (ACTOS) 30 MG tablet Take 1 tablet (30  mg total) by mouth daily. 90 tablet 1   simvastatin (ZOCOR) 40 MG tablet TAKE 1 TABLET BY MOUTH EVERYDAY AT BEDTIME 30 tablet 1   No facility-administered medications prior to visit.    PAST MEDICAL HISTORY: Past Medical History:  Diagnosis Date   Diabetes mellitus 04/2007   AODM   Hyperlipidemia    Obesity     PAST SURGICAL HISTORY: Past Surgical History:  Procedure Laterality Date   NO PAST SURGERIES      FAMILY HISTORY: Family History  Problem Relation Age of Onset   Diabetes Mother     Heart disease Mother    Hypertension Mother    Diabetes Father    Cancer Father    Diabetes Sister     SOCIAL HISTORY: Social History   Socioeconomic History   Marital status: Married    Spouse name: Jolyssa Oplinger   Number of children: 2   Years of education: 14   Highest education level: Not on file  Occupational History   Occupation: [employer]  Tobacco Use   Smoking status: Never   Smokeless tobacco: Never  Substance and Sexual Activity   Alcohol use: No   Drug use: No   Sexual activity: Not Currently  Other Topics Concern   Not on file  Social History Narrative   Right handed   Caffeine use: soda (about 32 oz per day)   Social Determinants of Health   Financial Resource Strain: Not on file  Food Insecurity: Not on file  Transportation Needs: Not on file  Physical Activity: Not on file  Stress: Not on file  Social Connections: Not on file  Intimate Partner Violence: Not on file     PHYSICAL EXAM  GENERAL EXAM/CONSTITUTIONAL: Vitals:  Vitals:   10/20/21 1425  BP: 123/73  Pulse: 90  Weight: 204 lb (92.5 kg)  Height: 5' 7"  (1.702 m)   Body mass index is 31.95 kg/m. Wt Readings from Last 3 Encounters:  10/20/21 204 lb (92.5 kg)  09/02/21 200 lb (90.7 kg)  06/01/21 197 lb (89.4 kg)   Patient is in no distress; well developed, nourished and groomed; neck is supple  EYES: Pupils round and reactive to light, Visual fields full to confrontation, Extraocular movements intacts,   MUSCULOSKELETAL: Gait, strength, tone, movements noted in Neurologic exam below  NEUROLOGIC: MENTAL STATUS:  No flowsheet data found. awake, alert, oriented to person, place and time recent and remote memory intact normal attention and concentration language fluent, comprehension intact, naming intact fund of knowledge appropriate  CRANIAL NERVE:  2nd - no papilledema or hemorrhages on fundoscopic exam 2nd, 3rd, 4th, 6th - pupils equal and  reactive to light, visual fields full to confrontation, extraocular muscles intact, no nystagmus 5th - facial sensation symmetric 7th - facial strength symmetric 8th - hearing intact 9th - palate elevates symmetrically, uvula midline 11th - shoulder shrug symmetric 12th - tongue protrusion midline  MOTOR:  normal bulk and tone, full strength in the BUE, BLE  SENSORY:  normal and symmetric to light touch, pinprick, temperature, vibration  COORDINATION:  finger-nose-finger, fine finger movements normal  REFLEXES:  deep tendon reflexes present and symmetric  GAIT/STATION:  normal   DIAGNOSTIC DATA (LABS, IMAGING, TESTING) - I reviewed patient records, labs, notes, testing and imaging myself where available.  Lab Results  Component Value Date   WBC 7.6 01/25/2021   HGB 11.6 01/25/2021   HCT 34.9 01/25/2021   MCV 82 01/25/2021   PLT 369 01/25/2021  Component Value Date/Time   NA 139 01/25/2021 1150   K 4.1 01/25/2021 1150   CL 100 01/25/2021 1150   CO2 23 01/25/2021 1150   GLUCOSE 208 (H) 01/25/2021 1150   GLUCOSE 98 09/18/2017 0914   BUN 9 01/25/2021 1150   CREATININE 0.70 01/25/2021 1150   CREATININE 0.55 09/18/2017 0914   CALCIUM 9.7 01/25/2021 1150   PROT 7.4 01/25/2021 1150   ALBUMIN 4.2 01/25/2021 1150   AST 17 01/25/2021 1150   ALT 10 01/25/2021 1150   ALKPHOS 50 01/25/2021 1150   BILITOT 0.2 01/25/2021 1150   GFRNONAA 102 01/25/2021 1150   GFRAA 118 01/25/2021 1150   Lab Results  Component Value Date   CHOL 116 01/25/2021   HDL 33 (L) 01/25/2021   LDLCALC 64 01/25/2021   TRIG 98 01/25/2021   CHOLHDL 3.5 01/25/2021   Lab Results  Component Value Date   HGBA1C 7.3 (A) 06/01/2021   No results found for: VITAMINB12 No results found for: TSH   Head CT 09/03/2021 No acute intracranial abnormality.   ASSESSMENT AND PLAN  50 y.o. year old female with past medical history of diabetes mellitus type 2, hypertension, hyperlipidemia who is  presenting with headaches for the past 8 to 9 months.  Patient described episode of headache lasting 3 to 4 days followed by headache free period for the next 2 months.  Her last episode was in September where she was having headache for 7 days.  She did try ibuprofen and Tylenol at home without any relief then presented to the ED where she received a Toradol shot which provided some relief and headache resolved after 2 days.  She denies any migrainous features.  I will start the patient on tizanidine and naproxen to take together when she had another episode of headaches.  Encouraged her to drink plenty of fluid, rest well and avoid any stressors.  I will see her in 57-monthfor follow-up   1. Tension-type headache, not intractable, unspecified chronicity pattern     PLAN: Take both Tizanidine and Naproxen as needed for the headaches  Return in 6 months or sooner for follow up.   No orders of the defined types were placed in this encounter.   Meds ordered this encounter  Medications   tiZANidine (ZANAFLEX) 4 MG tablet    Sig: Take 1 tablet (4 mg total) by mouth every 8 (eight) hours as needed for muscle spasms.    Dispense:  30 tablet    Refill:  0   naproxen (NAPROSYN) 500 MG tablet    Sig: Take 1 tablet (500 mg total) by mouth every 12 (twelve) hours as needed.    Dispense:  30 tablet    Refill:  0     Return in about 6 months (around 04/20/2022).    AAlric Ran MD 10/20/2021, 2:58 PM  [facility] [address] [place] [phone]

## 2021-10-20 NOTE — Patient Instructions (Signed)
Take both Tizanidine and Naproxen as needed for the headaches  Return in 6 months or sooner for follow up.

## 2021-10-22 ENCOUNTER — Encounter: Payer: Self-pay | Admitting: Family Medicine

## 2021-10-22 ENCOUNTER — Other Ambulatory Visit: Payer: Self-pay

## 2021-10-22 ENCOUNTER — Ambulatory Visit: Payer: 59 | Admitting: Family Medicine

## 2021-10-22 VITALS — BP 122/76 | HR 87 | Temp 97.8°F | Wt 204.4 lb

## 2021-10-22 DIAGNOSIS — E1159 Type 2 diabetes mellitus with other circulatory complications: Secondary | ICD-10-CM | POA: Diagnosis not present

## 2021-10-22 DIAGNOSIS — Z23 Encounter for immunization: Secondary | ICD-10-CM | POA: Diagnosis not present

## 2021-10-22 DIAGNOSIS — E669 Obesity, unspecified: Secondary | ICD-10-CM

## 2021-10-22 DIAGNOSIS — E1169 Type 2 diabetes mellitus with other specified complication: Secondary | ICD-10-CM | POA: Diagnosis not present

## 2021-10-22 DIAGNOSIS — E118 Type 2 diabetes mellitus with unspecified complications: Secondary | ICD-10-CM

## 2021-10-22 DIAGNOSIS — I152 Hypertension secondary to endocrine disorders: Secondary | ICD-10-CM

## 2021-10-22 DIAGNOSIS — E785 Hyperlipidemia, unspecified: Secondary | ICD-10-CM

## 2021-10-22 LAB — POCT GLYCOSYLATED HEMOGLOBIN (HGB A1C): Hemoglobin A1C: 8.2 % — AB (ref 4.0–5.6)

## 2021-10-22 MED ORDER — ERTUGLIFLOZIN-METFORMIN HCL 7.5-1000 MG PO TABS: 1.0000 | ORAL_TABLET | Freq: Two times a day (BID) | ORAL | 5 refills | Status: DC

## 2021-10-22 NOTE — Progress Notes (Signed)
  Subjective:    Patient ID: Heather Sherman, female    DOB: [birth year], 50 y.o.   MRN: [id number]  Heather Sherman is a 50 y.o. female who presents for follow-up of Type 2 diabetes mellitus.  Home blood sugar records: fasting range: Lowest reading 131 highest 265 Current symptoms/problems include none and have been unchanged. Daily foot check:: yes  Any foot concerns: none Exercise:  staying active at least 20 min a day Diet: fair She continues on Januvia, glyburide, metformin and Actos.  She is also taking the simvastatin for her lipids as well as irbesartan/hydrochlorothiazide.  She keeps herself quite busy physically but is not involved in any regular exercise program.  She does not smoke. The following portions of the patient's history were reviewed and updated as appropriate: allergies, current medications, past medical history, past social history and problem list.  ROS as in subjective above.     Objective:    Physical Exam Alert and in no distress otherwise not examined.  Hemoglobin A1c is 8.2 Lab Review Diabetic Labs Latest Ref Rng & Units 06/01/2021 01/25/2021 09/04/2020 03/23/2020 11/07/2019  HbA1c 4.0 - 5.6 % 7.3(A) 7.5(A) 8.8(A) 8.1(A) 8.1(A)  Microalbumin mg/L - 6.3 - <5.0 -  Micro/Creat Ratio - - 5.3 - <6.1 -  Chol 100 - 199 mg/dL - 116 - - 120  HDL >39 mg/dL - 33(L) - - 36(L)  Calc LDL 0 - 99 mg/dL - 64 - - 66  Triglycerides 0 - 149 mg/dL - 98 - - 96  Creatinine 0.57 - 1.00 mg/dL - 0.70 - - 0.57   BP/Weight 10/20/2021 09/03/2021 09/02/2021 06/01/2021 1/59/4707  Systolic BP 615 183 - 437 357  Diastolic BP 73 71 - 80 76  Wt. (Lbs) 204 - 200 197 195.4  BMI 31.95 - 31.32 30.85 30.6   Foot/eye exam completion dates Latest Ref Rng & Units 01/25/2021 11/07/2019  Eye Exam No Retinopathy - -  Foot Form Completion - Done Done    Del  reports that she has never smoked. She has never used smokeless tobacco. She reports that she does not drink alcohol and does not use  drugs.     Assessment & Plan:    Type 2 diabetes with complication (HCC)  Hypertension associated with diabetes ([place])  Hyperlipidemia associated with type 2 diabetes mellitus ([place])  Obesity (BMI 30-39.9)  Rx changes:  [place] Education: Reviewed 'ABCs' of diabetes management (respective goals in parentheses):  A1C (<7), blood pressure (<130/80), and cholesterol (LDL <100). Compliance at present is estimated to be good. Efforts to improve compliance (if necessary) will be directed at increased exercise. Follow up: 4 months I explained that we are limited by her insurance plan as to what we can use on her.  Hopefully discount card will help with this.  I explained how this should work.  If she has questions she will call.

## 2021-11-01 ENCOUNTER — Other Ambulatory Visit: Payer: Self-pay | Admitting: Medical

## 2021-11-30 ENCOUNTER — Other Ambulatory Visit: Payer: Self-pay | Admitting: Family Medicine

## 2021-11-30 DIAGNOSIS — E1169 Type 2 diabetes mellitus with other specified complication: Secondary | ICD-10-CM

## 2021-11-30 DIAGNOSIS — Z794 Long term (current) use of insulin: Secondary | ICD-10-CM

## 2021-11-30 DIAGNOSIS — E118 Type 2 diabetes mellitus with unspecified complications: Secondary | ICD-10-CM

## 2021-12-30 ENCOUNTER — Other Ambulatory Visit: Payer: Self-pay | Admitting: Medical

## 2021-12-30 ENCOUNTER — Other Ambulatory Visit: Payer: Self-pay | Admitting: Family Medicine

## 2022-01-07 ENCOUNTER — Other Ambulatory Visit: Payer: Self-pay | Admitting: Family Medicine

## 2022-01-07 DIAGNOSIS — I152 Hypertension secondary to endocrine disorders: Secondary | ICD-10-CM

## 2022-03-01 ENCOUNTER — Other Ambulatory Visit: Payer: Self-pay | Admitting: Family Medicine

## 2022-03-01 DIAGNOSIS — E782 Mixed hyperlipidemia: Secondary | ICD-10-CM

## 2022-03-01 DIAGNOSIS — Z794 Long term (current) use of insulin: Secondary | ICD-10-CM

## 2022-03-01 DIAGNOSIS — E1169 Type 2 diabetes mellitus with other specified complication: Secondary | ICD-10-CM

## 2022-03-01 DIAGNOSIS — E118 Type 2 diabetes mellitus with unspecified complications: Secondary | ICD-10-CM

## 2022-03-09 ENCOUNTER — Ambulatory Visit: Payer: 59 | Admitting: Family Medicine

## 2022-03-14 ENCOUNTER — Ambulatory Visit: Payer: 59 | Admitting: Family Medicine

## 2022-03-14 ENCOUNTER — Other Ambulatory Visit: Payer: Self-pay

## 2022-03-14 ENCOUNTER — Other Ambulatory Visit: Payer: Self-pay | Admitting: Family Medicine

## 2022-03-14 ENCOUNTER — Encounter: Payer: Self-pay | Admitting: Family Medicine

## 2022-03-14 VITALS — BP 132/78 | HR 92 | Temp 97.9°F | Ht 66.0 in | Wt 209.8 lb

## 2022-03-14 DIAGNOSIS — E669 Obesity, unspecified: Secondary | ICD-10-CM

## 2022-03-14 DIAGNOSIS — E1159 Type 2 diabetes mellitus with other circulatory complications: Secondary | ICD-10-CM | POA: Diagnosis not present

## 2022-03-14 DIAGNOSIS — E785 Hyperlipidemia, unspecified: Secondary | ICD-10-CM

## 2022-03-14 DIAGNOSIS — E1169 Type 2 diabetes mellitus with other specified complication: Secondary | ICD-10-CM

## 2022-03-14 DIAGNOSIS — Z1231 Encounter for screening mammogram for malignant neoplasm of breast: Secondary | ICD-10-CM

## 2022-03-14 DIAGNOSIS — E118 Type 2 diabetes mellitus with unspecified complications: Secondary | ICD-10-CM

## 2022-03-14 DIAGNOSIS — Z1159 Encounter for screening for other viral diseases: Secondary | ICD-10-CM

## 2022-03-14 DIAGNOSIS — I152 Hypertension secondary to endocrine disorders: Secondary | ICD-10-CM

## 2022-03-14 LAB — POCT GLYCOSYLATED HEMOGLOBIN (HGB A1C): Hemoglobin A1C: 9.8 % — AB (ref 4.0–5.6)

## 2022-03-14 LAB — POCT UA - MICROALBUMIN
Albumin/Creatinine Ratio, Urine, POC: 11.6
Creatinine, POC: 121.2 mg/dL
Microalbumin Ur, POC: 14 mg/L

## 2022-03-14 MED ORDER — LEVEMIR FLEXPEN 100 UNIT/ML ~~LOC~~ SOPN: 10.0000 [IU] | PEN_INJECTOR | Freq: Every day | SUBCUTANEOUS | 11 refills | Status: DC

## 2022-03-14 NOTE — Telephone Encounter (Signed)
Pt is checking with insurance to find out what is covered. [place] ?

## 2022-03-14 NOTE — Progress Notes (Signed)
?Subjective:  ? ? Patient ID: Heather Sherman, female    DOB: March 07, 1971, 51 y.o.   MRN: [id number] ? ?Heather Sherman is a 51 y.o. female who presents for follow-up of Type 2 diabetes mellitus. ? ?Home blood sugar records:  post meal lowest reading 175- highest reading 288 ?Current symptoms/problems include hyperglycemia    and have been unchanged. ?Daily foot checks:yes  Any foot concerns: none at this time ?Exercise:  staying active  ?Diet: fair ?She continues on glyburide and Januvia as well as metformin and pioglitazone.  We have tried in the past to use other medications including Levemir however cost of the meds can be expensive for her.  She continues on simvastatin as well as irbesartan/HCTZ.  She does keep her self physically active. ?The following portions of the patient's history were reviewed and updated as appropriate: allergies, current medications, past medical history, past social history and problem list. ? ?ROS as in subjective above. ? ?   ?Objective:  ?  ?Physical Exam ?Alert and in no distress otherwise not examined. ? ?Hemoglobin A1c is 9.8 ? ?Lab Review ?Diabetic Labs Latest Ref Rng & Units 10/22/2021 06/01/2021 01/25/2021 09/04/2020 03/23/2020  ?HbA1c 4.0 - 5.6 % 8.2(A) 7.3(A) 7.5(A) 8.8(A) 8.1(A)  ?Microalbumin mg/L - - 6.3 - <5.0  ?Micro/Creat Ratio - - - 5.3 - <6.1  ?Chol 100 - 199 mg/dL - - 116 - -  ?HDL >39 mg/dL - - 33(L) - -  ?Calc LDL 0 - 99 mg/dL - - 64 - -  ?Triglycerides 0 - 149 mg/dL - - 98 - -  ?Creatinine 0.57 - 1.00 mg/dL - - 0.70 - -  ? ?BP/Weight 10/22/2021 10/20/2021 09/03/2021 09/02/2021 06/01/2021  ?Systolic BP 643 329 518 - 110  ?Diastolic BP 76 73 71 - 80  ?Wt. (Lbs) 204.4 204 - 200 197  ?BMI 32.01 31.95 - 31.32 30.85  ? ?Foot/eye exam completion dates Latest Ref Rng & Units 01/25/2021 11/07/2019  ?Eye Exam No Retinopathy - -  ?Foot Form Completion - Done Done  ? ? ?Heather Sherman  reports that she has never smoked. She has never used smokeless tobacco. She reports that she does not  drink alcohol and does not use drugs. ? ?   ?Assessment & Plan:  ?  ?Hypertension associated with diabetes ([place]) - Plan: CBC with Differential/Platelet, Comprehensive metabolic panel ? ?Hyperlipidemia associated with type 2 diabetes mellitus ([place]) - Plan: Lipid panel ? ?Type 2 diabetes with complication (HCC) - Plan: CBC with Differential/Platelet, Comprehensive metabolic panel, Lipid panel, POCT glycosylated hemoglobin (Hb A1C), POCT UA - Microalbumin, insulin detemir (LEVEMIR FLEXPEN) 100 UNIT/ML FlexPen ? ?Obesity (BMI 30-39.9) - Plan: CBC with Differential/Platelet, Comprehensive metabolic panel, Lipid panel ? ?Need for hepatitis C screening test - Plan: Hepatitis C antibody ? ?Encounter for screening mammogram for malignant neoplasm of breast - Plan: MM Digital Screening ? ?Rx changes:  I will place her back on the Levemir.  She will keep me informed as to how her insurance will cover this.  Explained that we might need to switch to a different medicine based on her insurance. ?Education: Reviewed ?ABCs? of diabetes management (respective goals in parentheses):  A1C (<7), blood pressure (<130/80), and cholesterol (LDL <100). ?Compliance at present is estimated to be poor. Efforts to improve compliance (if necessary) will be directed at increased exercise. ?Follow up: 4 months ?She is to stop taking the glyburide.  Gave her instructions on slowly increasing her insulin dosing by 2 units every 2  days until her blood sugar is under 120.  She will keep me informed as to whether the insurance will cover Levemir or some other long-acting insulin preparation. ? ? ? ?  ?

## 2022-03-14 NOTE — Patient Instructions (Addendum)
Start with 10 units of Levemir every night and check your blood sugar in the morning.  Increase the Levemir by 2 units every 2 days until your morning blood sugar is under 120.  The first thing then going to stop is the glipizide.  That is that the thing that does not really mix well with blood sugar with with insulin in terms of that glyburide can drop your blood sugar off the bottom and we just do not like doing it with insuli ?

## 2022-03-15 ENCOUNTER — Telehealth: Payer: Self-pay

## 2022-03-15 LAB — COMPREHENSIVE METABOLIC PANEL
ALT: 21 IU/L (ref 0–32)
AST: 22 IU/L (ref 0–40)
Albumin/Globulin Ratio: 1.5 (ref 1.2–2.2)
Albumin: 4.8 g/dL (ref 3.8–4.8)
Alkaline Phosphatase: 57 IU/L (ref 44–121)
BUN/Creatinine Ratio: 11 (ref 9–23)
BUN: 8 mg/dL (ref 6–24)
Bilirubin Total: <0.2 mg/dL (ref 0.0–1.2)
CO2: 25 mmol/L (ref 20–29)
Calcium: 9.7 mg/dL (ref 8.7–10.2)
Chloride: 98 mmol/L (ref 96–106)
Creatinine, Ser: 0.7 mg/dL (ref 0.57–1.00)
Globulin, Total: 3.3 g/dL (ref 1.5–4.5)
Glucose: 104 mg/dL — ABNORMAL HIGH (ref 70–99)
Potassium: 3.9 mmol/L (ref 3.5–5.2)
Sodium: 139 mmol/L (ref 134–144)
Total Protein: 8.1 g/dL (ref 6.0–8.5)
eGFR: 105 mL/min/{1.73_m2} (ref >59)

## 2022-03-15 LAB — CBC WITH DIFFERENTIAL/PLATELET
Basophils Absolute: 0.1 10*3/uL (ref 0.0–0.2)
Basos: 1 %
EOS (ABSOLUTE): 0.2 10*3/uL (ref 0.0–0.4)
Eos: 3 %
Hematocrit: 38.3 % (ref 34.0–46.6)
Hemoglobin: 12.2 g/dL (ref 11.1–15.9)
Immature Grans (Abs): 0 10*3/uL (ref 0.0–0.1)
Immature Granulocytes: 0 %
Lymphocytes Absolute: 2.8 10*3/uL (ref 0.7–3.1)
Lymphs: 37 %
MCH: 26 pg — ABNORMAL LOW (ref 26.6–33.0)
MCHC: 31.9 g/dL (ref 31.5–35.7)
MCV: 82 fL (ref 79–97)
Monocytes Absolute: 0.6 10*3/uL (ref 0.1–0.9)
Monocytes: 8 %
Neutrophils Absolute: 4 10*3/uL (ref 1.4–7.0)
Neutrophils: 51 %
Platelets: 353 10*3/uL (ref 150–450)
RBC: 4.7 x10E6/uL (ref 3.77–5.28)
RDW: 13.2 % (ref 11.7–15.4)
WBC: 7.8 10*3/uL (ref 3.4–10.8)

## 2022-03-15 LAB — LIPID PANEL
Chol/HDL Ratio: 3.9 ratio (ref 0.0–4.4)
Cholesterol, Total: 141 mg/dL (ref 100–199)
HDL: 36 mg/dL — ABNORMAL LOW (ref >39)
LDL Chol Calc (NIH): 85 mg/dL (ref 0–99)
Triglycerides: 110 mg/dL (ref 0–149)
VLDL Cholesterol Cal: 20 mg/dL (ref 5–40)

## 2022-03-15 LAB — HEPATITIS C ANTIBODY: Hep C Virus Ab: NONREACTIVE

## 2022-03-15 MED ORDER — LANTUS SOLOSTAR 100 UNIT/ML ~~LOC~~ SOPN: 10.0000 [IU] | PEN_INJECTOR | Freq: Every day | SUBCUTANEOUS | 99 refills | Status: DC

## 2022-03-15 NOTE — Telephone Encounter (Signed)
Pt. Called back stating she checked with her ins. And they cover the Lantus insulin pen.  ?

## 2022-03-16 ENCOUNTER — Telehealth: Payer: Self-pay

## 2022-03-16 NOTE — Telephone Encounter (Signed)
Called pt for labs and she advised that they will cover lantus please send in. [facility] ?

## 2022-03-29 ENCOUNTER — Telehealth: Payer: Self-pay | Admitting: Family Medicine

## 2022-03-29 NOTE — Telephone Encounter (Signed)
Pt called and is requesting the needles for the lantus pens pt uses [id number]- [place], [place] - [address] ?

## 2022-03-31 ENCOUNTER — Other Ambulatory Visit: Payer: Self-pay

## 2022-03-31 DIAGNOSIS — E118 Type 2 diabetes mellitus with unspecified complications: Secondary | ICD-10-CM

## 2022-03-31 MED ORDER — INSULIN PEN NEEDLE 30G X 8 MM MISC
10.0000 | Status: AC | PRN
Start: 2022-03-31 — End: ?

## 2022-03-31 NOTE — Telephone Encounter (Signed)
Sent in to [facility] ?

## 2022-04-04 ENCOUNTER — Other Ambulatory Visit: Payer: Self-pay | Admitting: Family Medicine

## 2022-04-07 ENCOUNTER — Telehealth: Payer: Self-pay

## 2022-04-07 NOTE — Telephone Encounter (Signed)
Lvm for pt to call back to confirm the pharmacy. [place] ?

## 2022-04-07 NOTE — Telephone Encounter (Signed)
Pt. Called back stating she called last week about needles for her Lantus pen and the pharmacy told her they still have not heard from [place]. I told her that you had a note that you had sent it in on 03/31/22. If you could check on that for her and call her back and let her know once it has been sent in to the pharmacy.  ?

## 2022-04-07 NOTE — Telephone Encounter (Signed)
Called pharmacy and called in the needles for the pts. Lantus insulin pen.  ?

## 2022-04-08 ENCOUNTER — Telehealth: Payer: Self-pay | Admitting: Family Medicine

## 2022-04-08 NOTE — Telephone Encounter (Signed)
Lvm for pt to clal back to confirm if the needles were sent to the right pharmacy. [place] ?

## 2022-04-08 NOTE — Telephone Encounter (Signed)
Pt called to confirm needles were sent to correct pharmacy ?

## 2022-04-13 ENCOUNTER — Ambulatory Visit: Payer: 59 | Admitting: Neurology

## 2022-04-19 ENCOUNTER — Other Ambulatory Visit: Payer: Self-pay | Admitting: Family Medicine

## 2022-04-19 DIAGNOSIS — Z1231 Encounter for screening mammogram for malignant neoplasm of breast: Secondary | ICD-10-CM

## 2022-04-20 ENCOUNTER — Ambulatory Visit: Payer: 59 | Admitting: Neurology

## 2022-04-27 ENCOUNTER — Ambulatory Visit
Admission: RE | Admit: 2022-04-27 | Discharge: 2022-04-27 | Disposition: A | Discharge status: Home / Self Care | Payer: 59 | Source: Ambulatory Visit | Attending: Family Medicine | Admitting: Family Medicine

## 2022-06-08 ENCOUNTER — Other Ambulatory Visit: Payer: Self-pay | Admitting: Family Medicine

## 2022-06-08 DIAGNOSIS — E1169 Type 2 diabetes mellitus with other specified complication: Secondary | ICD-10-CM

## 2022-06-20 ENCOUNTER — Encounter: Payer: Self-pay | Admitting: Family Medicine

## 2022-06-20 ENCOUNTER — Ambulatory Visit: Payer: 59 | Admitting: Family Medicine

## 2022-06-20 VITALS — BP 126/70 | HR 78 | Temp 97.2°F | Wt 202.2 lb

## 2022-06-20 DIAGNOSIS — E1159 Type 2 diabetes mellitus with other circulatory complications: Secondary | ICD-10-CM | POA: Diagnosis not present

## 2022-06-20 DIAGNOSIS — E669 Obesity, unspecified: Secondary | ICD-10-CM

## 2022-06-20 DIAGNOSIS — I152 Hypertension secondary to endocrine disorders: Secondary | ICD-10-CM

## 2022-06-20 DIAGNOSIS — E1169 Type 2 diabetes mellitus with other specified complication: Secondary | ICD-10-CM

## 2022-06-20 DIAGNOSIS — E785 Hyperlipidemia, unspecified: Secondary | ICD-10-CM

## 2022-06-20 DIAGNOSIS — E118 Type 2 diabetes mellitus with unspecified complications: Secondary | ICD-10-CM | POA: Diagnosis not present

## 2022-06-20 LAB — POCT GLYCOSYLATED HEMOGLOBIN (HGB A1C): Hemoglobin A1C: 11.1 % — AB (ref 4.0–5.6)

## 2022-06-22 ENCOUNTER — Other Ambulatory Visit: Payer: Self-pay | Admitting: Family Medicine

## 2022-07-10 ENCOUNTER — Other Ambulatory Visit: Payer: Self-pay | Admitting: Family Medicine

## 2022-07-10 DIAGNOSIS — I152 Hypertension secondary to endocrine disorders: Secondary | ICD-10-CM

## 2022-08-03 ENCOUNTER — Other Ambulatory Visit: Payer: Self-pay | Admitting: Family Medicine

## 2022-08-03 DIAGNOSIS — E118 Type 2 diabetes mellitus with unspecified complications: Secondary | ICD-10-CM

## 2022-08-05 ENCOUNTER — Other Ambulatory Visit: Payer: Self-pay | Admitting: Family Medicine

## 2022-09-20 ENCOUNTER — Other Ambulatory Visit: Payer: Self-pay | Admitting: Family Medicine

## 2022-11-05 ENCOUNTER — Other Ambulatory Visit: Payer: Self-pay | Admitting: Family Medicine

## 2022-11-05 DIAGNOSIS — E1159 Type 2 diabetes mellitus with other circulatory complications: Secondary | ICD-10-CM

## 2022-11-30 ENCOUNTER — Ambulatory Visit (INDEPENDENT_AMBULATORY_CARE_PROVIDER_SITE_OTHER): Payer: 59 | Admitting: Family Medicine

## 2022-11-30 ENCOUNTER — Encounter: Payer: Self-pay | Admitting: Family Medicine

## 2022-11-30 VITALS — BP 122/74 | HR 94 | Wt 197.6 lb

## 2022-11-30 DIAGNOSIS — E118 Type 2 diabetes mellitus with unspecified complications: Secondary | ICD-10-CM | POA: Diagnosis not present

## 2022-11-30 DIAGNOSIS — Z91199 Patient's noncompliance with other medical treatment and regimen due to unspecified reason: Secondary | ICD-10-CM | POA: Diagnosis not present

## 2022-11-30 DIAGNOSIS — Z23 Encounter for immunization: Secondary | ICD-10-CM

## 2022-11-30 LAB — POCT GLYCOSYLATED HEMOGLOBIN (HGB A1C): Hemoglobin A1C: 10.9 % — AB (ref 4.0–5.6)

## 2022-11-30 NOTE — Patient Instructions (Signed)
Increase your Lantus by 2 units every 2 days until your blood sugar in the morning is down to 120

## 2022-11-30 NOTE — Progress Notes (Signed)
Subjective:    Patient ID: Heather Sherman, female    DOB: Jul 06, 1971, 51 y.o.   MRN: [id number]  Heather Sherman is a 51 y.o. female who presents for follow-up of Type 2 diabetes mellitus.  Home blood sugar records:  blood sugars are not checked Current symptoms/problems include paresthesia of the feet and have been stable. Daily foot checks: yes   Any foot concerns: no How often blood sugars checked: once every 2-3 weeks Exercise: Home exercise routine includes walking. Diet: unhealthy diet Presently she is taking 24 units of Lantus insulin as well as Januvia, metformin and Actos.  She does not check her sugars regularly and cannot really tell me how she decided to have only used 24 units.  She continues on simvastatin.  She is also taking her irbesartan/HCTZ. The following portions of the patient's history were reviewed and updated as appropriate: allergies, current medications, past medical history, past social history and problem list.  ROS as in subjective above.     Objective:    Physical Exam Alert and in no distress otherwise not examined. Hemoglobin A1c is 10.9. Blood pressure 122/74, pulse 94, weight 197 lb 9.6 oz (89.6 kg), SpO2 98 %.  Lab Review    Latest Ref Rng & Units 06/20/2022   10:17 AM 03/14/2022    3:20 PM 03/14/2022    2:45 PM 10/22/2021   10:09 AM 06/01/2021   11:02 AM  Diabetic Labs  HbA1c 4.0 - 5.6 % 11.1  9.8   8.2  7.3   Microalbumin mg/L  14.0      Micro/Creat Ratio   11.6      Chol 100 - 199 mg/dL   141     HDL >39 mg/dL   36     Calc LDL 0 - 99 mg/dL   85     Triglycerides 0 - 149 mg/dL   110     Creatinine 0.57 - 1.00 mg/dL   0.70         11/30/2022    3:47 PM 06/20/2022    9:20 AM 03/14/2022    2:13 PM 10/22/2021    9:30 AM 10/20/2021    2:25 PM  BP/Weight  Systolic BP 123XX123 123XX123 [id number]  Diastolic BP 74 70 78 76 73  Wt. (Lbs) 197.6 202.2 209.8 204.4 204  BMI 31.89 kg/m2 32.64 kg/m2 33.86 kg/m2 32.01 kg/m2 31.95 kg/m2       03/14/2022    2:00 PM 01/25/2021   11:00 AM  Foot/eye exam completion dates  Foot Form Completion Done Done    Laurie  reports that she has never smoked. She has never used smokeless tobacco. She reports that she does not drink alcohol and does not use drugs.     Assessment & Plan:    Type 2 diabetes with complication (HCC) - Plan: POCT glycosylated hemoglobin (Hb A1C)  Personal history of noncompliance with medical treatment, presenting hazards to health  Need for Tdap vaccination - Plan: Tdap vaccine greater than or equal to 7yo IM  Need for influenza vaccination - Plan: Flu Vaccine QUAD 6+ mos PF IM (Fluarix Quad PF) I stressed the need for her to do a better job of taking care of herself and checking her blood sugars much more often.  I will have her increase her Lantus insulin by 2 units every 2 days until her blood sugar is down around 120.  I plan to see her back in about 4 months or sooner  if there are issues.

## 2022-12-02 ENCOUNTER — Other Ambulatory Visit: Payer: Self-pay | Admitting: Family Medicine

## 2022-12-02 DIAGNOSIS — E1159 Type 2 diabetes mellitus with other circulatory complications: Secondary | ICD-10-CM

## 2022-12-06 ENCOUNTER — Other Ambulatory Visit: Payer: Self-pay | Admitting: Family Medicine

## 2022-12-06 DIAGNOSIS — E1169 Type 2 diabetes mellitus with other specified complication: Secondary | ICD-10-CM

## 2022-12-19 ENCOUNTER — Other Ambulatory Visit: Payer: Self-pay | Admitting: Family Medicine

## 2023-01-23 ENCOUNTER — Other Ambulatory Visit: Payer: Self-pay | Admitting: Family Medicine

## 2023-03-26 ENCOUNTER — Other Ambulatory Visit: Payer: Self-pay | Admitting: Family Medicine

## 2023-04-03 ENCOUNTER — Other Ambulatory Visit: Payer: Self-pay | Admitting: Family Medicine

## 2023-04-04 ENCOUNTER — Encounter: Payer: 59 | Admitting: Family Medicine

## 2023-04-20 ENCOUNTER — Other Ambulatory Visit: Payer: Self-pay | Admitting: Family Medicine

## 2023-04-20 NOTE — Telephone Encounter (Signed)
I called patient and advised her that she is overdue for a follow up office visit. Patient agreed to schedule appointment, but is unable to come in until the end of May 2024. Follow up appointment scheduled for 05/30/2023.This is the next available appointment towards the end of May since Dr. Susann Givens is scheduled to be out of the office around that time.

## 2023-05-15 ENCOUNTER — Other Ambulatory Visit: Payer: Self-pay | Admitting: Family Medicine

## 2023-05-15 DIAGNOSIS — E1169 Type 2 diabetes mellitus with other specified complication: Secondary | ICD-10-CM

## 2023-05-30 ENCOUNTER — Ambulatory Visit: Payer: 59 | Admitting: Family Medicine

## 2023-06-14 ENCOUNTER — Other Ambulatory Visit: Payer: Self-pay | Admitting: Family Medicine

## 2023-06-14 NOTE — Telephone Encounter (Signed)
Patient is overdue for a diabetes follow up. I called patient and advised her that its time to schedule an appointment. Patient informed me of the reason she hasn't been able to come into the office, which was due to her job. 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 The new management was supposed to offer health insurance coverage for their employees, but still hasn't yet. Patient cannot afford an office visit right now. Patient needs a refill on Metformin. Is it ok to refill?

## 2023-06-18 ENCOUNTER — Other Ambulatory Visit: Payer: Self-pay | Admitting: Family Medicine

## 2023-06-18 DIAGNOSIS — I152 Hypertension secondary to endocrine disorders: Secondary | ICD-10-CM

## 2023-09-13 ENCOUNTER — Ambulatory Visit: Payer: 59 | Admitting: Family Medicine

## 2023-09-13 ENCOUNTER — Encounter: Payer: Self-pay | Admitting: Family Medicine

## 2023-09-13 VITALS — BP 122/80 | HR 86 | Ht 66.75 in | Wt 194.0 lb

## 2023-09-13 DIAGNOSIS — Z91199 Patient's noncompliance with other medical treatment and regimen due to unspecified reason: Secondary | ICD-10-CM

## 2023-09-13 DIAGNOSIS — E1169 Type 2 diabetes mellitus with other specified complication: Secondary | ICD-10-CM | POA: Diagnosis not present

## 2023-09-13 DIAGNOSIS — E782 Mixed hyperlipidemia: Secondary | ICD-10-CM

## 2023-09-13 DIAGNOSIS — E669 Obesity, unspecified: Secondary | ICD-10-CM

## 2023-09-13 DIAGNOSIS — Z23 Encounter for immunization: Secondary | ICD-10-CM | POA: Diagnosis not present

## 2023-09-13 DIAGNOSIS — E1159 Type 2 diabetes mellitus with other circulatory complications: Secondary | ICD-10-CM

## 2023-09-13 DIAGNOSIS — I152 Hypertension secondary to endocrine disorders: Secondary | ICD-10-CM

## 2023-09-13 DIAGNOSIS — E118 Type 2 diabetes mellitus with unspecified complications: Secondary | ICD-10-CM | POA: Diagnosis not present

## 2023-09-13 DIAGNOSIS — E785 Hyperlipidemia, unspecified: Secondary | ICD-10-CM

## 2023-09-13 LAB — POCT UA - MICROALBUMIN
Albumin/Creatinine Ratio, Urine, POC: 6.1
Creatinine, POC: 234.4 mg/dL
Microalbumin Ur, POC: 14.4 mg/L

## 2023-09-13 LAB — POCT GLYCOSYLATED HEMOGLOBIN (HGB A1C): Hemoglobin A1C: 10.7 % — AB (ref 4.0–5.6)

## 2023-09-13 MED ORDER — SIMVASTATIN 40 MG PO TABS
ORAL_TABLET | ORAL | 5 refills | Status: DC
Start: 2023-09-13 — End: 2024-02-28

## 2023-09-13 MED ORDER — METFORMIN HCL 1000 MG PO TABS
1000.0000 mg | ORAL_TABLET | Freq: Two times a day (BID) | ORAL | 1 refills | Status: DC
Start: 2023-09-13 — End: 2024-02-19

## 2023-09-13 MED ORDER — IRBESARTAN-HYDROCHLOROTHIAZIDE 150-12.5 MG PO TABS: 1.0000 | ORAL_TABLET | Freq: Every day | ORAL | 2 refills | Status: DC

## 2023-09-13 MED ORDER — SOLIQUA 100-33 UNT-MCG/ML ~~LOC~~ SOPN
15.0000 [IU] | PEN_INJECTOR | Freq: Every day | SUBCUTANEOUS | 5 refills | Status: DC
Start: 2023-09-13 — End: 2024-02-28

## 2023-09-13 NOTE — Patient Instructions (Signed)
Check your blood sugars twice per day but definitely in the morning.  Keep a record of your blood sugar.  Check them either before a meal or 2 hours after meal

## 2023-09-13 NOTE — Progress Notes (Signed)
Subjective:    Patient ID: Heather Sherman, female    DOB: 25-Jun-1971, 52 y.o.   MRN: [id number]  Heather Sherman is a 52 y.o. female who presents for follow-up of Type 2 diabetes mellitus.  Patient is not checking home blood sugars.   Home blood sugar records: patient does not check sugars How often is blood sugars being checked: device broken unable to check Current symptoms/problems include paresthesia of the feet and have been unchanged. Daily foot checks: yes   Any foot concerns: none Last eye exam: Not since 2022 Exercise: Home exercise routine includes walking 0.5 hrs per week. She lost her insurance for several months but was able to get most of her medicines except for Januvia.  She does need to have all of her medications renewed.  She also will need to be referred to ophthalmology. The following portions of the patient's history were reviewed and updated as appropriate: allergies, current medications, past medical history, past social history and problem list.  ROS as in subjective above.     Objective:    Physical Exam Alert and in no distress. Tympanic membranes and canals are normal. Pharyngeal area is normal. Neck is supple without adenopathy or thyromegaly. Cardiac exam shows a regular sinus rhythm without murmurs or gallops. Lungs are clear to auscultation. Hemoglobin A1c is 10.7 Lab Review    Latest Ref Rng & Units 09/13/2023    3:33 PM 09/13/2023    2:48 PM 11/30/2022    4:00 PM 06/20/2022   10:17 AM 03/14/2022    3:20 PM  Diabetic Labs  HbA1c 4.0 - 5.6 %  10.7  10.9  11.1  9.8   Microalbumin mg/L 14.4     14.0   Micro/Creat Ratio  6.1     11.6       09/13/2023    2:34 PM 11/30/2022    3:47 PM 06/20/2022    9:20 AM 03/14/2022    2:13 PM 10/22/2021    9:30 AM  BP/Weight  Systolic BP 122 122 126 132 122  Diastolic BP 80 74 70 78 76  Wt. (Lbs) 194 197.6 202.2 209.8 204.4  BMI 30.61 kg/m2 31.89 kg/m2 32.64 kg/m2 33.86 kg/m2 32.01 kg/m2      03/14/2022     2:00 PM 01/25/2021   11:00 AM  Foot/eye exam completion dates  Foot Form Completion Done Done    Adila  reports that she has never smoked. She has never used smokeless tobacco. She reports that she does not drink alcohol and does not use drugs.     Assessment & Plan:    Type 2 diabetes with complication (HCC) - Plan: CBC with Differential/Platelet, Comprehensive metabolic panel, Lipid panel, POCT glycosylated hemoglobin (Hb A1C), POCT UA - Microalbumin, Insulin Glargine-Lixisenatide (SOLIQUA) 100-33 UNT-MCG/ML SOPN, Ambulatory referral to Ophthalmology, metFORMIN (GLUCOPHAGE) 1000 MG tablet  Obesity (BMI 30-39.9)  Hypertension associated with diabetes (HCC) - Plan: CBC with Differential/Platelet, Comprehensive metabolic panel, irbesartan-hydrochlorothiazide (AVALIDE) 150-12.5 MG tablet  Hyperlipidemia associated with type 2 diabetes mellitus (HCC) - Plan: Lipid panel  Need for shingles vaccine - Plan: Zoster Recombinant (Shingrix ), CANCELED: Varicella-zoster vaccine IM  Need for influenza vaccination - Plan: Flu vaccine trivalent PF, 6mos and older(Flulaval,Afluria,Fluarix,Fluzone)  Mixed hyperlipidemia due to type 2 diabetes mellitus (HCC) - Plan: simvastatin (ZOCOR) 40 MG tablet I have decided to switch some of her medications around.  I will switch her to [facility] and continue metformin.  Continue on the antihypertensive medication as well as  her simvastatin.  She will check to see if her insurance will cover the [place].  Will also refer her to ophthalmology.  She is to return here in 2 weeks so we can fine-tune the use of the [place].  She was comfortable with doing that.

## 2023-09-14 LAB — CBC WITH DIFFERENTIAL/PLATELET
Basophils Absolute: 0.1 10*3/uL (ref 0.0–0.2)
Basos: 1 %
EOS (ABSOLUTE): 0.2 10*3/uL (ref 0.0–0.4)
Eos: 2 %
Hematocrit: 35.3 % (ref 34.0–46.6)
Hemoglobin: 11.3 g/dL (ref 11.1–15.9)
Immature Grans (Abs): 0 10*3/uL (ref 0.0–0.1)
Immature Granulocytes: 0 %
Lymphocytes Absolute: 2.7 10*3/uL (ref 0.7–3.1)
Lymphs: 33 %
MCH: 27.2 pg (ref 26.6–33.0)
MCHC: 32 g/dL (ref 31.5–35.7)
MCV: 85 fL (ref 79–97)
Monocytes Absolute: 0.6 10*3/uL (ref 0.1–0.9)
Monocytes: 7 %
Neutrophils Absolute: 4.8 10*3/uL (ref 1.4–7.0)
Neutrophils: 57 %
Platelets: 284 10*3/uL (ref 150–450)
RBC: 4.15 x10E6/uL (ref 3.77–5.28)
RDW: 13 % (ref 11.7–15.4)
WBC: 8.3 10*3/uL (ref 3.4–10.8)

## 2023-09-14 LAB — COMPREHENSIVE METABOLIC PANEL
ALT: 11 IU/L (ref 0–32)
AST: 14 IU/L (ref 0–40)
Albumin: 4.2 g/dL (ref 3.8–4.9)
Alkaline Phosphatase: 59 IU/L (ref 44–121)
BUN/Creatinine Ratio: 14 (ref 9–23)
BUN: 9 mg/dL (ref 6–24)
Bilirubin Total: <0.2 mg/dL (ref 0.0–1.2)
CO2: 23 mmol/L (ref 20–29)
Calcium: 10.1 mg/dL (ref 8.7–10.2)
Chloride: 101 mmol/L (ref 96–106)
Creatinine, Ser: 0.63 mg/dL (ref 0.57–1.00)
Globulin, Total: 3.1 g/dL (ref 1.5–4.5)
Glucose: 114 mg/dL — ABNORMAL HIGH (ref 70–99)
Potassium: 4.2 mmol/L (ref 3.5–5.2)
Sodium: 139 mmol/L (ref 134–144)
Total Protein: 7.3 g/dL (ref 6.0–8.5)
eGFR: 107 mL/min/{1.73_m2} (ref >59)

## 2023-09-14 LAB — LIPID PANEL
Chol/HDL Ratio: 3.1 ratio (ref 0.0–4.4)
Cholesterol, Total: 126 mg/dL (ref 100–199)
HDL: 41 mg/dL (ref >39)
LDL Chol Calc (NIH): 69 mg/dL (ref 0–99)
Triglycerides: 82 mg/dL (ref 0–149)
VLDL Cholesterol Cal: 16 mg/dL (ref 5–40)

## 2023-10-04 ENCOUNTER — Encounter: Payer: Self-pay | Admitting: Family Medicine

## 2023-10-04 ENCOUNTER — Ambulatory Visit: Payer: 59 | Admitting: Family Medicine

## 2023-10-04 VITALS — BP 124/82 | HR 81 | Wt 191.2 lb

## 2023-10-04 DIAGNOSIS — E118 Type 2 diabetes mellitus with unspecified complications: Secondary | ICD-10-CM

## 2023-10-04 DIAGNOSIS — E1169 Type 2 diabetes mellitus with other specified complication: Secondary | ICD-10-CM

## 2023-10-04 DIAGNOSIS — Z23 Encounter for immunization: Secondary | ICD-10-CM

## 2023-10-04 DIAGNOSIS — I152 Hypertension secondary to endocrine disorders: Secondary | ICD-10-CM

## 2023-10-04 DIAGNOSIS — E669 Obesity, unspecified: Secondary | ICD-10-CM

## 2023-10-04 NOTE — Progress Notes (Signed)
The take-home message Subjective:    Patient ID: Heather Sherman, female    DOB: 1971/03/16, 52 y.o.   MRN: [id number]  Heather Sherman is a 52 y.o. female who presents for follow-up of Type 2 diabetes mellitus.  Patient is checking home blood sugars.   Home blood sugar records: BGs range between 130 and 200 How often is blood sugars being checked: fasting every morning and non-fasting in afternoon Current symptoms/problems include hyperglycemia, increase appetite, paresthesia of the feet, and polyuria and have been stable. Daily foot checks: yes   Any foot concerns: none Last eye exam: September 2021 Exercise: Home exercise routine includes walking 0.5 hrs per week. She is not taking Soliqua up to 26 units.  She has been checking her fingerstick blood sugars usually twice per day either before meal or 2 hours after meal.  She is not interested in having freestyle libre or Dexcom. The following portions of the patient's history were reviewed and updated as appropriate: allergies, current medications, past medical history, past social history and problem list.  ROS as in subjective above.     Objective:    Physical Exam Alert and in no distress otherwise not examined.  I reviewed her blood sugars readings and over the last week the numbers of slightly come down. Lab Review    Latest Ref Rng & Units 09/13/2023    3:33 PM 09/13/2023    3:20 PM 09/13/2023    2:48 PM 11/30/2022    4:00 PM 06/20/2022   10:17 AM  Diabetic Labs  HbA1c 4.0 - 5.6 %   10.7  10.9  11.1   Microalbumin mg/L 14.4       Micro/Creat Ratio  6.1       Chol 100 - 199 mg/dL  409      HDL >81 mg/dL  41      Calc LDL 0 - 99 mg/dL  69      Triglycerides 0 - 149 mg/dL  82      Creatinine 1.91 - 1.00 mg/dL  4.78          29/04/6212    3:45 PM 09/13/2023    2:34 PM 11/30/2022    3:47 PM 06/20/2022    9:20 AM 03/14/2022    2:13 PM  BP/Weight  Systolic BP 124 122 122 126 132  Diastolic BP 82 80 74 70 78  Wt.  (Lbs) 191.2 194 197.6 202.2 209.8  BMI 30.17 kg/m2 30.61 kg/m2 31.89 kg/m2 32.64 kg/m2 33.86 kg/m2      03/14/2022    2:00 PM 01/25/2021   11:00 AM  Foot/eye exam completion dates  Foot Form Completion Done Done    Aria  reports that she has never smoked. She has never used smokeless tobacco. She reports that she does not drink alcohol and does not use drugs.     Assessment & Plan:    Type 2 diabetes with complication (HCC)  Obesity (BMI 30-39.9)  Need for COVID-19 vaccine - Plan: Pfizer Comirnaty Covid -19 Vaccine 35yrs and older  I tried to convince her to go on the freestyle libre or Dexcom but she still not interested.  She will continue to check her blood sugars twice per day before meals or 2 hours after.  She is to increase her Soliqua by 2 units every 2 days until her morning blood sugars around 120.  I also then discussed that 2 hours after meal it should be 180 or lower.  Recheck here  in 4 months.

## 2023-10-04 NOTE — Patient Instructions (Signed)
Increase the Soliqua by 2 units every 2 days until your morning blood sugar is below 120

## 2023-11-06 IMAGING — MG MM DIGITAL SCREENING BILAT W/ TOMO AND CAD
8 series · 8 of 24 positions shown · non-contrast
Comparison: Previous exam(s).

CLINICAL DATA: Screening.

EXAM:
DIGITAL SCREENING BILATERAL MAMMOGRAM WITH TOMOSYNTHESIS AND CAD
TECHNIQUE: Bilateral screening digital craniocaudal and mediolateral oblique
mammograms were obtained. Bilateral screening digital breast
tomosynthesis was performed. The images were evaluated with
computer-aided detection.

[R CC synth-2D]
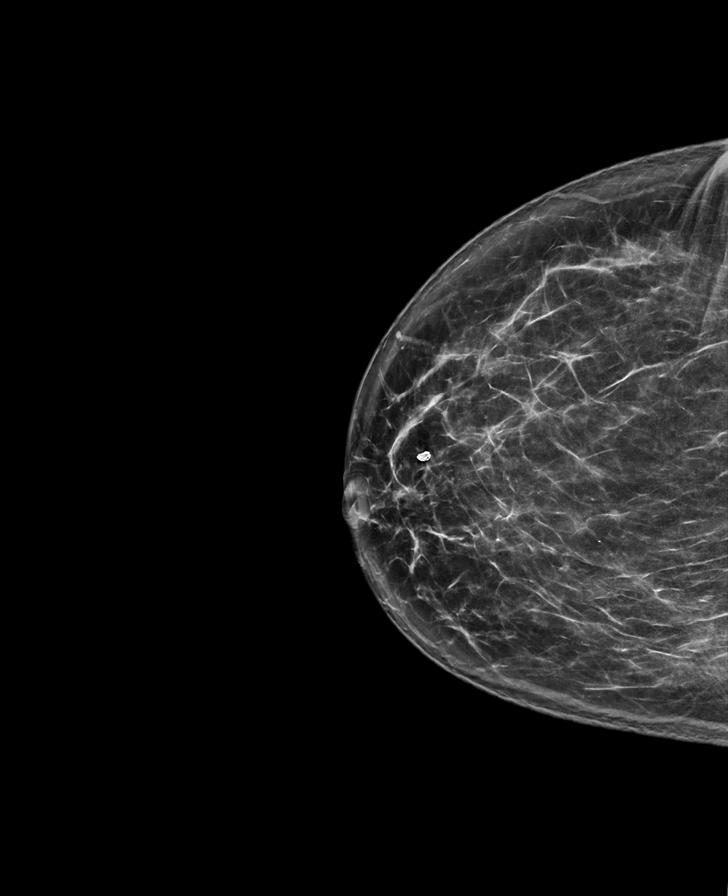

[L CC synth-2D]
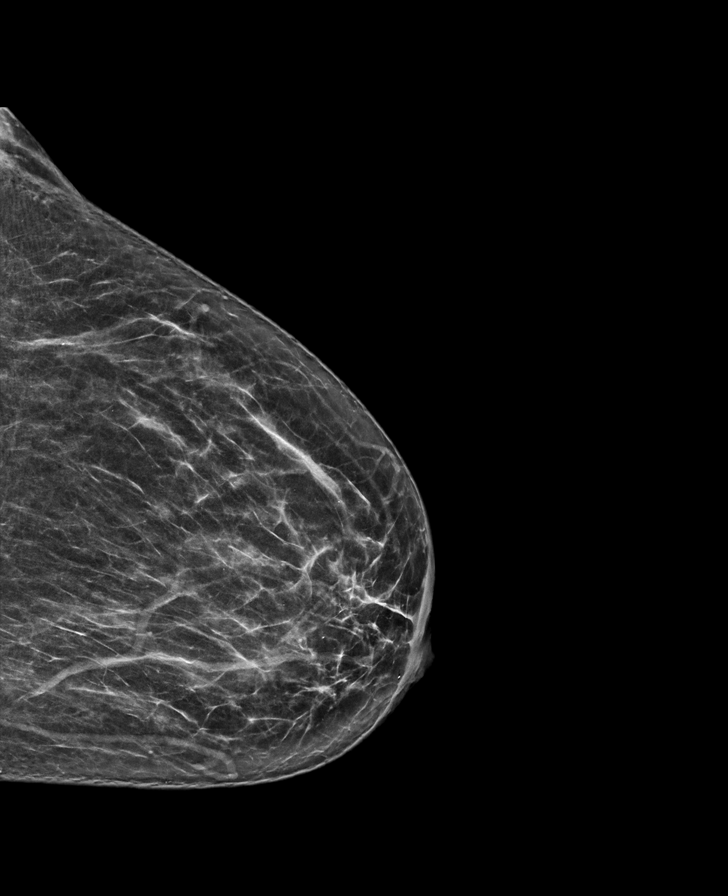

[R MLO synth-2D]
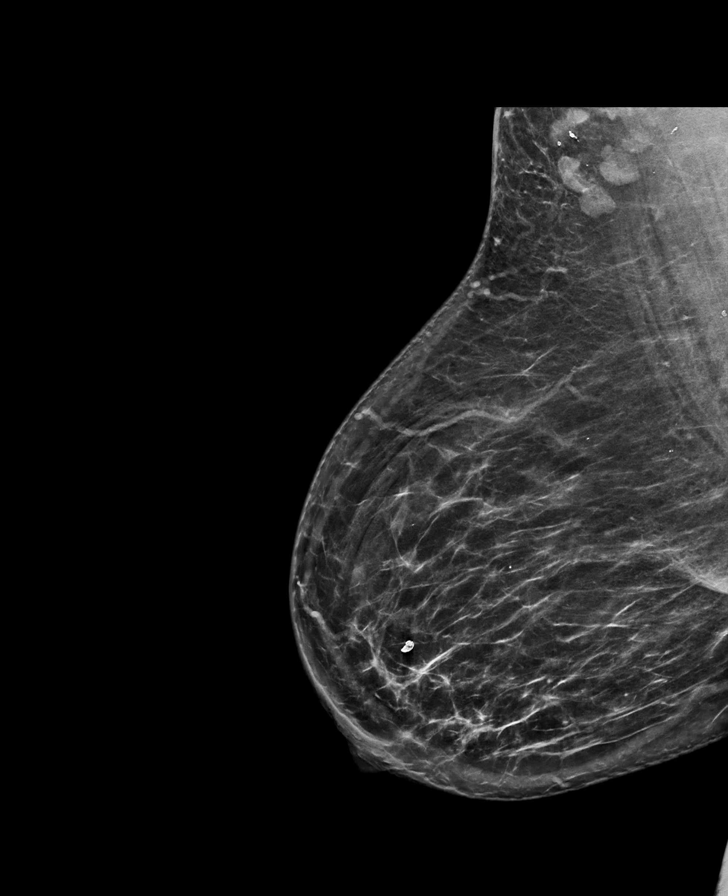

[L MLO synth-2D]
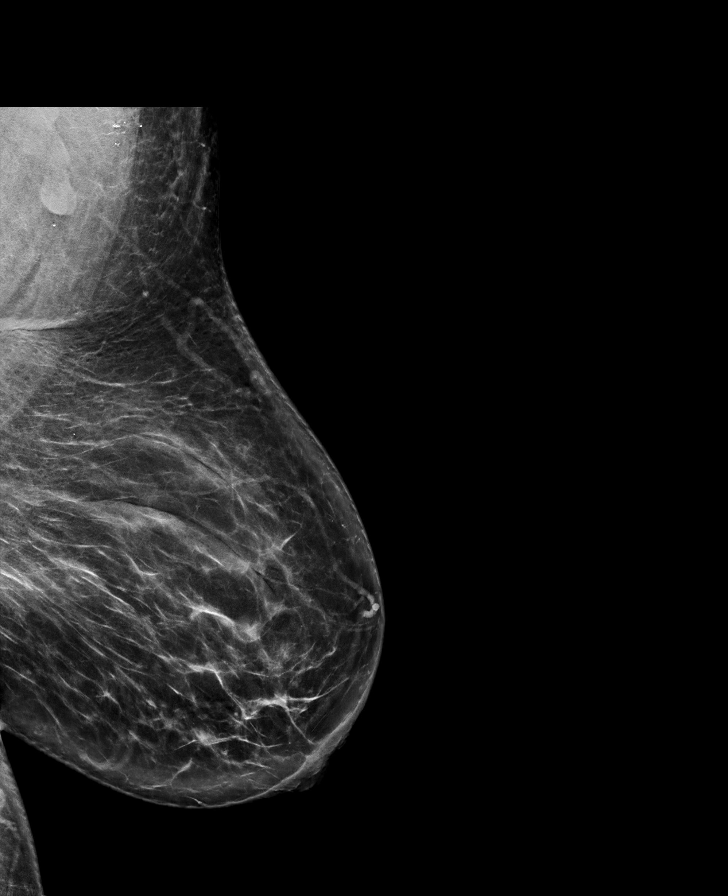

[R CC tomo · tomo slice 37/72.0]
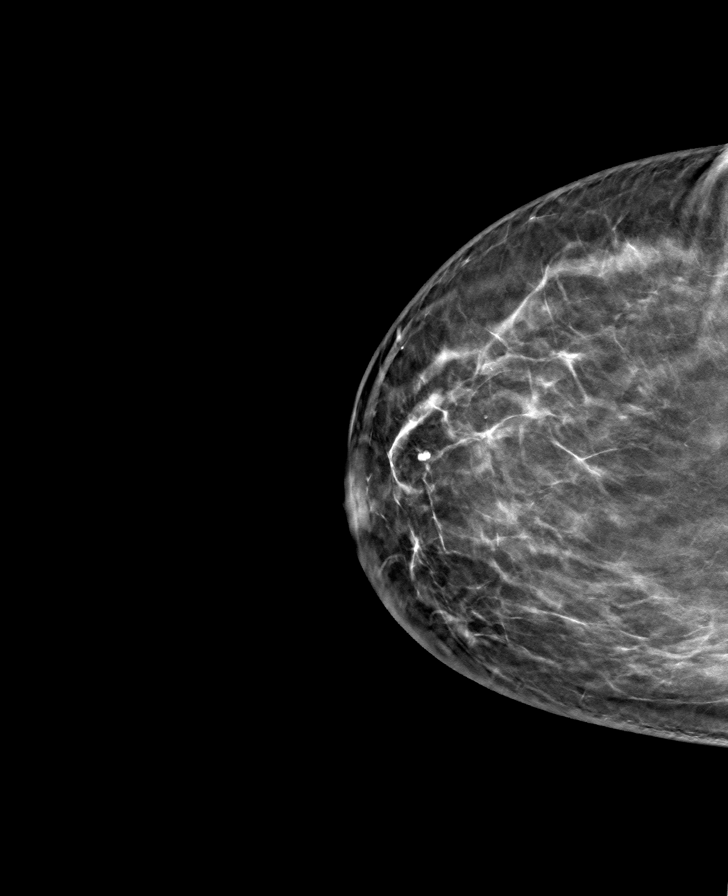

[L MLO tomo · tomo slice 41/81.0]
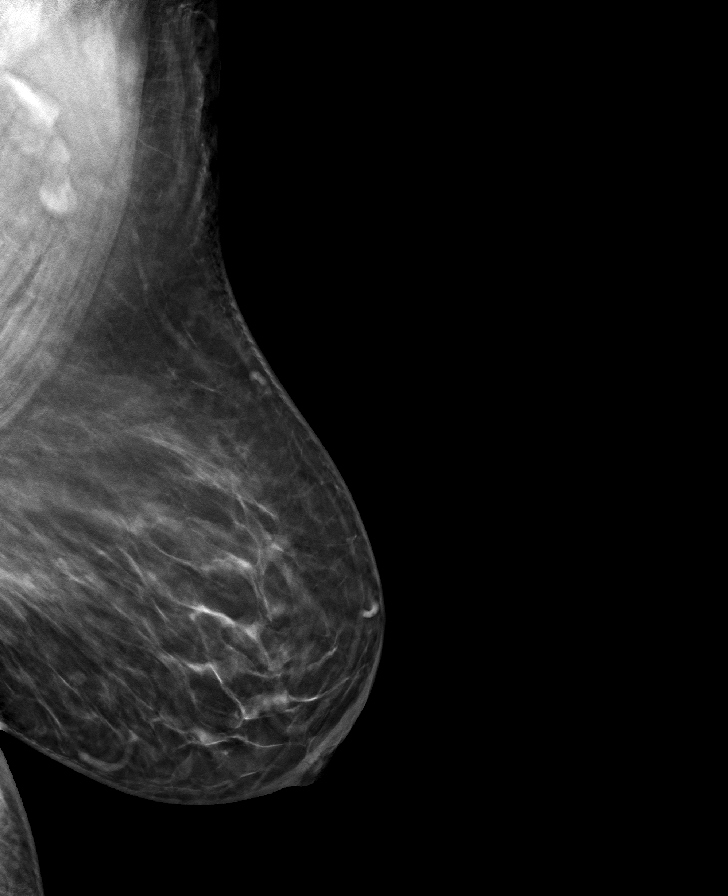

[L CC tomo · tomo slice 33/65.0]
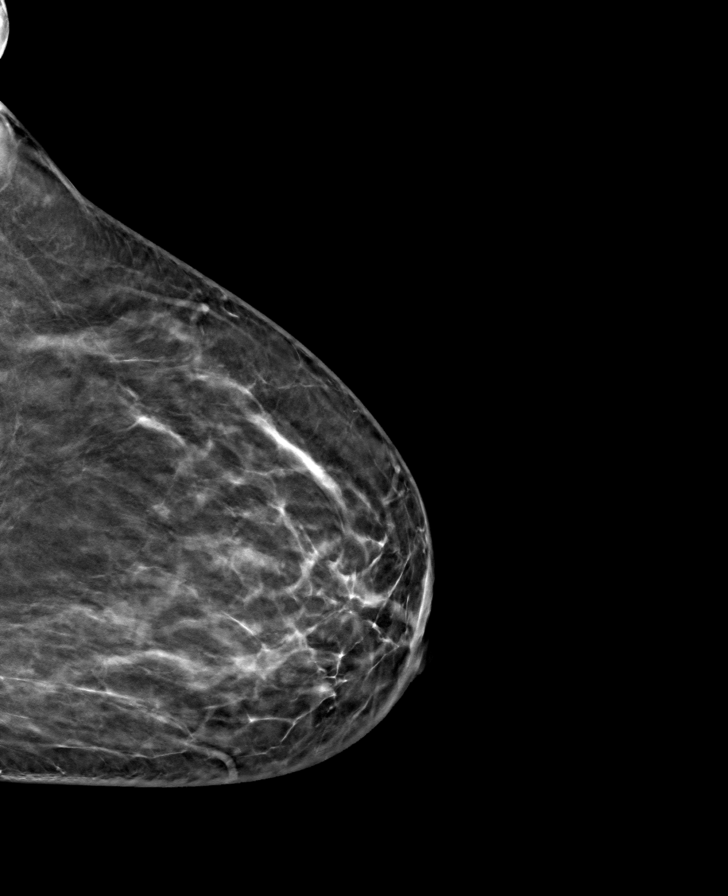

[R MLO tomo · tomo slice 39/77.0]
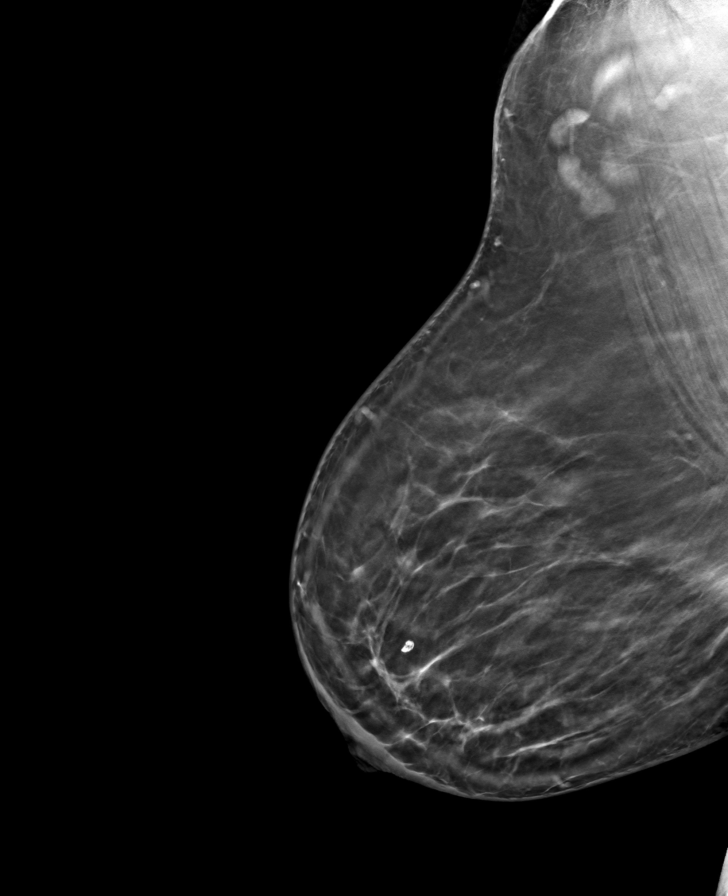

[8 of 24 positions shown; findings below may reference images not displayed]

ACR Breast Density Category b: There are scattered areas of
fibroglandular density.
FINDINGS: There are no findings suspicious for malignancy.
IMPRESSION: No mammographic evidence of malignancy. A result letter of this
screening mammogram will be mailed directly to the patient.

RECOMMENDATION:
Screening mammogram in one year. (Code:51-O-LD2)

BI-RADS CATEGORY  1: Negative.

## 2023-12-23 ENCOUNTER — Other Ambulatory Visit: Payer: Self-pay | Admitting: Family Medicine

## 2023-12-23 DIAGNOSIS — I152 Hypertension secondary to endocrine disorders: Secondary | ICD-10-CM

## 2024-02-19 ENCOUNTER — Other Ambulatory Visit: Payer: Self-pay | Admitting: Family Medicine

## 2024-02-19 DIAGNOSIS — E118 Type 2 diabetes mellitus with unspecified complications: Secondary | ICD-10-CM

## 2024-02-19 DIAGNOSIS — I152 Hypertension secondary to endocrine disorders: Secondary | ICD-10-CM

## 2024-02-21 ENCOUNTER — Ambulatory Visit: Payer: 59 | Admitting: Family Medicine

## 2024-02-28 ENCOUNTER — Ambulatory Visit: Payer: 59 | Admitting: Family Medicine

## 2024-02-28 ENCOUNTER — Encounter: Payer: Self-pay | Admitting: Family Medicine

## 2024-02-28 VITALS — BP 122/80 | HR 86 | Ht 67.0 in | Wt 195.0 lb

## 2024-02-28 DIAGNOSIS — E785 Hyperlipidemia, unspecified: Secondary | ICD-10-CM

## 2024-02-28 DIAGNOSIS — E669 Obesity, unspecified: Secondary | ICD-10-CM

## 2024-02-28 DIAGNOSIS — Z23 Encounter for immunization: Secondary | ICD-10-CM | POA: Diagnosis not present

## 2024-02-28 DIAGNOSIS — E118 Type 2 diabetes mellitus with unspecified complications: Secondary | ICD-10-CM

## 2024-02-28 DIAGNOSIS — E1159 Type 2 diabetes mellitus with other circulatory complications: Secondary | ICD-10-CM | POA: Diagnosis not present

## 2024-02-28 DIAGNOSIS — E1169 Type 2 diabetes mellitus with other specified complication: Secondary | ICD-10-CM | POA: Diagnosis not present

## 2024-02-28 DIAGNOSIS — Z91199 Patient's noncompliance with other medical treatment and regimen due to unspecified reason: Secondary | ICD-10-CM

## 2024-02-28 DIAGNOSIS — E782 Mixed hyperlipidemia: Secondary | ICD-10-CM

## 2024-02-28 DIAGNOSIS — I152 Hypertension secondary to endocrine disorders: Secondary | ICD-10-CM

## 2024-02-28 LAB — POCT GLYCOSYLATED HEMOGLOBIN (HGB A1C): Hemoglobin A1C: 10.2 % — AB (ref 4.0–5.6)

## 2024-02-28 MED ORDER — SIMVASTATIN 40 MG PO TABS
ORAL_TABLET | ORAL | 5 refills | Status: DC
Start: 2024-02-28 — End: 2024-10-07

## 2024-02-28 MED ORDER — SOLIQUA 100-33 UNT-MCG/ML ~~LOC~~ SOPN
15.0000 [IU] | PEN_INJECTOR | Freq: Every day | SUBCUTANEOUS | 5 refills | Status: DC
Start: 2024-02-28 — End: 2024-07-02

## 2024-02-28 NOTE — Patient Instructions (Signed)
 Increase your Soliqua by 2 units every 2 days until your morning blood sugar is under 120 okay`

## 2024-02-28 NOTE — Progress Notes (Signed)
 Subjective:    Patient ID: Heather Sherman, female    DOB: [birth year], 53 y.o.   MRN: [id number]  Heather Sherman is a 53 y.o. female who presents for follow-up of Type 2 diabetes mellitus.  Patient is checking home blood sugars.   Home blood sugar records: BGs range between 141 and 300 How often is blood sugars being checked: twice a week  Current symptoms/problems include paresthesia of the feet and polyuria and have been unchanged. Daily foot checks: yes   Any foot concerns: none Last eye exam: Over 2 years  Exercise: Home exercise routine includes walking 0.25 hrs per week. She does not check her blood sugars regularly stating she only checks them on Sunday and Tuesday.  She has however increased her Soliqua to 30 units from 15 units.  Also continues on metformin as well as simvastatin and irbesartan.  Smoking and drinking was reviewed and she does not do that. The following portions of the patient's history were reviewed and updated as appropriate: allergies, current medications, past medical history, past social history and problem list.  ROS as in subjective above.     Objective:    Physical Exam Alert and in no distress otherwise not examined. Hemoglobin A1c is 10.2 Blood pressure 122/80, pulse 86, height 5\' 7"  (1.702 m), weight 195 lb (88.5 kg), last menstrual period 01/29/2024, SpO2 99%.  Lab Review    Latest Ref Rng & Units 02/28/2024   11:04 AM 09/13/2023    3:33 PM 09/13/2023    3:20 PM 09/13/2023    2:48 PM 11/30/2022    4:00 PM  Diabetic Labs  HbA1c 4.0 - 5.6 % 10.2    10.7  10.9   Microalbumin mg/L  14.4      Micro/Creat Ratio   6.1      Chol 100 - 199 mg/dL   657     HDL >84 mg/dL   41     Calc LDL 0 - 99 mg/dL   69     Triglycerides 0 - 149 mg/dL   82     Creatinine 6.96 - 1.00 mg/dL   2.95         02/02/4131   10:48 AM 10/04/2023    3:45 PM 09/13/2023    2:34 PM 11/30/2022    3:47 PM 06/20/2022    9:20 AM  BP/Weight  Systolic BP 122 124 122 122 126   Diastolic BP 80 82 80 74 70  Wt. (Lbs) 195 191.2 194 197.6 202.2  BMI 30.54 kg/m2 30.17 kg/m2 30.61 kg/m2 31.89 kg/m2 32.64 kg/m2      03/14/2022    2:00 PM 01/25/2021   11:00 AM  Foot/eye exam completion dates  Foot Form Completion Done Done    Heather Sherman  reports that she has never smoked. She has never used smokeless tobacco. She reports that she does not drink alcohol and does not use drugs.     Assessment & Plan:    Hyperlipidemia associated with type 2 diabetes mellitus (HCC)  Hypertension associated with diabetes (HCC)  Type 2 diabetes with complication (HCC) - Plan: POCT glycosylated hemoglobin (Hb A1C), Insulin Glargine-Lixisenatide (SOLIQUA) 100-33 UNT-MCG/ML SOPN  Obesity (BMI 30-39.9)  Personal history of noncompliance with medical treatment, presenting hazards to health  Mixed hyperlipidemia due to type 2 diabetes mellitus (HCC) - Plan: simvastatin (ZOCOR) 40 MG tablet  Need for shingles vaccine - Plan: Varicella-zoster vaccine IM I again stressed the need for her to take better control of  her diabetes and check her blood sugars much more often.  Again recommend that she increase her Soliqua by 2 units every 2 days until her morning blood sugar is under 120.  Stressed the need to do this to prevent the long-term sequela of diabetes not being adequately controlled in terms of stroke, blindness, kidney failure and neuropathy.

## 2024-04-24 ENCOUNTER — Other Ambulatory Visit: Payer: Self-pay | Admitting: Family Medicine

## 2024-04-24 DIAGNOSIS — E1159 Type 2 diabetes mellitus with other circulatory complications: Secondary | ICD-10-CM

## 2024-04-24 DIAGNOSIS — E118 Type 2 diabetes mellitus with unspecified complications: Secondary | ICD-10-CM

## 2024-07-02 ENCOUNTER — Ambulatory Visit (INDEPENDENT_AMBULATORY_CARE_PROVIDER_SITE_OTHER): Admitting: Family Medicine

## 2024-07-02 ENCOUNTER — Encounter: Payer: Self-pay | Admitting: Family Medicine

## 2024-07-02 VITALS — BP 118/72 | HR 87 | Wt 192.6 lb

## 2024-07-02 DIAGNOSIS — E118 Type 2 diabetes mellitus with unspecified complications: Secondary | ICD-10-CM | POA: Diagnosis not present

## 2024-07-02 DIAGNOSIS — Z91199 Patient's noncompliance with other medical treatment and regimen due to unspecified reason: Secondary | ICD-10-CM

## 2024-07-02 DIAGNOSIS — E669 Obesity, unspecified: Secondary | ICD-10-CM | POA: Diagnosis not present

## 2024-07-02 DIAGNOSIS — E1159 Type 2 diabetes mellitus with other circulatory complications: Secondary | ICD-10-CM

## 2024-07-02 DIAGNOSIS — I152 Hypertension secondary to endocrine disorders: Secondary | ICD-10-CM

## 2024-07-02 LAB — POCT GLYCOSYLATED HEMOGLOBIN (HGB A1C): Hemoglobin A1C: 10.5 % — AB (ref 4.0–5.6)

## 2024-07-02 MED ORDER — SOLIQUA 100-33 UNT-MCG/ML ~~LOC~~ SOPN: 50.0000 [IU] | PEN_INJECTOR | Freq: Every day | SUBCUTANEOUS | 5 refills | Status: AC

## 2024-07-02 MED ORDER — EMPAGLIFLOZIN 10 MG PO TABS: 10.0000 mg | ORAL_TABLET | Freq: Every day | ORAL | 5 refills | Status: DC

## 2024-07-02 NOTE — Patient Instructions (Addendum)
 Leave a message on MyChart if you have any difficulty with the new medication.  Otherwise I will see you in 4 months.  Go ahead and eat what you want but eat less.  You can also go higher on the Soliqua  if we need to based on your blood sugar so start checking it a little more often especially in the

## 2024-07-02 NOTE — Progress Notes (Signed)
 Subjective:    Patient ID: Heather Sherman, female    DOB: Nov 09, 1971, 53 y.o.   MRN: [id number]  Heather Sherman is a 53 y.o. female who presents for follow-up of Type 2 diabetes mellitus.  Home blood sugar records: does not take sugar recordsshe has been making changes in her Soliqua  based on how she is feeling and random blood sugar checks. Current symptoms/problems include feet and hands itch randomly and have been stable. Daily foot checks: yes   Any foot concerns: no just the itching  How often blood sugars checked: does not check sugars  Exercise: walking Diet: no diet restrictions she freely states that she eats anything she wants especially fried foods.  She continues on metformin  as well as Soliqua  at 40 units.  She is also taking simvastatin  and irbesartan  and having no difficulty with that. The following portions of the patient's history were reviewed and updated as appropriate: allergies, current medications, past medical history, past social history and problem list.  ROS as in subjective above.     Objective:    Physical Exam Alert and in no distress otherwise not examined. Hemoglobin A1c is 10.5   Lab Review    Latest Ref Rng & Units 07/02/2024   10:15 AM 02/28/2024   11:04 AM 09/13/2023    3:33 PM 09/13/2023    3:20 PM 09/13/2023    2:48 PM  Diabetic Labs  HbA1c 4.0 - 5.6 % 10.5  10.2    10.7   Microalbumin mg/L   14.4     Micro/Creat Ratio    6.1     Chol 100 - 199 mg/dL    873    HDL >60 mg/dL    41    Calc LDL 0 - 99 mg/dL    69    Triglycerides 0 - 149 mg/dL    82    Creatinine 9.42 - 1.00 mg/dL    9.36        01/26/7973    8:36 AM 02/28/2024   10:48 AM 10/04/2023    3:45 PM 09/13/2023    2:34 PM 11/30/2022    3:47 PM  BP/Weight  Systolic BP 118 122 124 122 122  Diastolic BP 72 80 82 80 74  Wt. (Lbs) 192.6 195 191.2 194 197.6  BMI 30.17 kg/m2 30.54 kg/m2 30.17 kg/m2 30.61 kg/m2 31.89 kg/m2      03/14/2022    2:00 PM 01/25/2021   11:00 AM   Foot/eye exam completion dates  Foot Form Completion Done Done    Heather Sherman  reports that she has never smoked. She has never used smokeless tobacco. She reports that she does not drink alcohol and does not use drugs.     Assessment & Plan:    Type 2 diabetes with complication (HCC) - Plan: POCT glycosylated hemoglobin (Hb A1C), empagliflozin  (JARDIANCE ) 10 MG TABS tablet, Insulin  Glargine-Lixisenatide (SOLIQUA ) 100-33 UNT-MCG/ML SOPN  Obesity (BMI 30-39.9)  Hypertension associated with diabetes (HCC)  Personal history of noncompliance with medical treatment, presenting hazards to health  Rx changes: Add Jardiance  to her regimen. Education: Reviewed 'ABCs' of diabetes management (respective goals in parentheses):  A1C (<7), blood pressure (<130/80), and cholesterol (LDL <100). Compliance at present is estimated to be poor. Efforts to improve compliance (if necessary) will be directed at regular blood sugar monitoring: daily. Follow up: 4 months Also discussed side effects of the new medication in terms of itching and rash in the genital area.

## 2024-08-18 ENCOUNTER — Other Ambulatory Visit: Payer: Self-pay | Admitting: Family Medicine

## 2024-08-18 DIAGNOSIS — E118 Type 2 diabetes mellitus with unspecified complications: Secondary | ICD-10-CM

## 2024-08-18 DIAGNOSIS — E1159 Type 2 diabetes mellitus with other circulatory complications: Secondary | ICD-10-CM

## 2024-10-05 ENCOUNTER — Other Ambulatory Visit: Payer: Self-pay | Admitting: Family Medicine

## 2024-10-05 DIAGNOSIS — E1169 Type 2 diabetes mellitus with other specified complication: Secondary | ICD-10-CM

## 2024-11-07 ENCOUNTER — Encounter: Payer: Self-pay | Admitting: Family Medicine

## 2024-11-07 ENCOUNTER — Ambulatory Visit: Admitting: Family Medicine

## 2024-11-07 ENCOUNTER — Ambulatory Visit: Payer: Self-pay | Admitting: Family Medicine

## 2024-11-07 VITALS — BP 134/78 | HR 72 | Ht 66.0 in | Wt 197.8 lb

## 2024-11-07 DIAGNOSIS — Z23 Encounter for immunization: Secondary | ICD-10-CM

## 2024-11-07 DIAGNOSIS — I152 Hypertension secondary to endocrine disorders: Secondary | ICD-10-CM

## 2024-11-07 DIAGNOSIS — E1169 Type 2 diabetes mellitus with other specified complication: Secondary | ICD-10-CM

## 2024-11-07 DIAGNOSIS — E118 Type 2 diabetes mellitus with unspecified complications: Secondary | ICD-10-CM | POA: Diagnosis not present

## 2024-11-07 DIAGNOSIS — E669 Obesity, unspecified: Secondary | ICD-10-CM

## 2024-11-07 DIAGNOSIS — Z91199 Patient's noncompliance with other medical treatment and regimen due to unspecified reason: Secondary | ICD-10-CM

## 2024-11-07 DIAGNOSIS — E782 Mixed hyperlipidemia: Secondary | ICD-10-CM

## 2024-11-07 DIAGNOSIS — E1159 Type 2 diabetes mellitus with other circulatory complications: Secondary | ICD-10-CM

## 2024-11-07 DIAGNOSIS — E785 Hyperlipidemia, unspecified: Secondary | ICD-10-CM

## 2024-11-07 DIAGNOSIS — Z7984 Long term (current) use of oral hypoglycemic drugs: Secondary | ICD-10-CM

## 2024-11-07 LAB — LIPID PANEL
Chol/HDL Ratio: 3.6 ratio (ref 0.0–4.4)
Cholesterol, Total: 137 mg/dL (ref 100–199)
HDL: 38 mg/dL — ABNORMAL LOW (ref >39)
LDL Chol Calc (NIH): 86 mg/dL (ref 0–99)
Triglycerides: 63 mg/dL (ref 0–149)
VLDL Cholesterol Cal: 13 mg/dL (ref 5–40)

## 2024-11-07 LAB — COMPREHENSIVE METABOLIC PANEL WITH GFR
ALT: 14 IU/L (ref 0–32)
AST: 13 IU/L (ref 0–40)
Albumin: 4.3 g/dL (ref 3.8–4.9)
Alkaline Phosphatase: 54 IU/L (ref 49–135)
BUN/Creatinine Ratio: 16 (ref 9–23)
BUN: 10 mg/dL (ref 6–24)
Bilirubin Total: 0.3 mg/dL (ref 0.0–1.2)
CO2: 24 mmol/L (ref 20–29)
Calcium: 9.7 mg/dL (ref 8.7–10.2)
Chloride: 102 mmol/L (ref 96–106)
Creatinine, Ser: 0.64 mg/dL (ref 0.57–1.00)
Globulin, Total: 2.9 g/dL (ref 1.5–4.5)
Glucose: 94 mg/dL (ref 70–99)
Potassium: 3.8 mmol/L (ref 3.5–5.2)
Sodium: 143 mmol/L (ref 134–144)
Total Protein: 7.2 g/dL (ref 6.0–8.5)
eGFR: 106 mL/min/1.73 (ref >59)

## 2024-11-07 LAB — CBC WITH DIFFERENTIAL/PLATELET
Basophils Absolute: 0.1 x10E3/uL (ref 0.0–0.2)
Basos: 1 %
EOS (ABSOLUTE): 0.1 x10E3/uL (ref 0.0–0.4)
Eos: 2 %
Hematocrit: 35.9 % (ref 34.0–46.6)
Hemoglobin: 11.7 g/dL (ref 11.1–15.9)
Immature Grans (Abs): 0 x10E3/uL (ref 0.0–0.1)
Immature Granulocytes: 0 %
Lymphocytes Absolute: 2.5 x10E3/uL (ref 0.7–3.1)
Lymphs: 37 %
MCH: 27.9 pg (ref 26.6–33.0)
MCHC: 32.6 g/dL (ref 31.5–35.7)
MCV: 86 fL (ref 79–97)
Monocytes Absolute: 0.5 x10E3/uL (ref 0.1–0.9)
Monocytes: 8 %
Neutrophils Absolute: 3.5 x10E3/uL (ref 1.4–7.0)
Neutrophils: 52 %
Platelets: 282 x10E3/uL (ref 150–450)
RBC: 4.19 x10E6/uL (ref 3.77–5.28)
RDW: 12.4 % (ref 11.7–15.4)
WBC: 6.7 x10E3/uL (ref 3.4–10.8)

## 2024-11-07 LAB — POCT GLYCOSYLATED HEMOGLOBIN (HGB A1C): Hemoglobin A1C: 10.1 % — AB (ref 4.0–5.6)

## 2024-11-07 MED ORDER — SIMVASTATIN 40 MG PO TABS: ORAL_TABLET | ORAL | 3 refills | Status: AC

## 2024-11-07 MED ORDER — BLOOD GLUCOSE MONITORING SUPPL DEVI: 1.0000 | 0 refills | Status: AC

## 2024-11-07 MED ORDER — IRBESARTAN-HYDROCHLOROTHIAZIDE 150-12.5 MG PO TABS
1.0000 | ORAL_TABLET | Freq: Every day | ORAL | 3 refills | Status: AC
Start: 2024-11-07 — End: ?

## 2024-11-07 MED ORDER — METFORMIN HCL 1000 MG PO TABS: 1000.0000 mg | ORAL_TABLET | Freq: Two times a day (BID) | ORAL | 1 refills | Status: AC

## 2024-11-07 NOTE — Progress Notes (Unsigned)
 Subjective:    Patient ID: Heather Sherman, female    DOB: 1971/05/14, 53 y.o.   MRN: [id number]  Heather Sherman is a 53 y.o. female who presents for follow-up of Type 2 diabetes mellitus.  Home blood sugar records: 310-320 range Current symptoms/problems include none and have been unchanged. Daily foot checks: yes   Any foot concerns: no How often blood sugars checked: not much Exercise: walks every "sunday Diet: General Discussed the use of AI scribe software for clinical note transcription with the patient, who gave verbal consent to proceed.   She manages her type 2 diabetes with Glucophage (metformin) and Soliqua, taking Soliqua at a dose of 40 units since August. She is not taking Jardiance due to concerns about potential side effects. Her She is not currently monitoring her blood glucose levels due to financial constraints and issues with her glucometer. She has not been using continuous glucose monitoring systems like the Freestyle Libre due to cost concerns and insurance coverage issues. She plans to purchase a new glucometer from [facility] as her current one is not functioning despite a battery replacement. She continues on simvastatin, irbesartan,  Her social history includes concerns about the rising costs of healthcare and insurance, particularly with her current provider, [facility]. She expresses frustration with the financial burden of managing her chronic condition and the potential for increased costs in the future. She does not smoke or drink alcohol.  Family history is significant for diabetes-related complications, as her father lost toes due to poorly controlled diabetes. She works at [employer] and is aware of the complications of diabetes, as she sees them frequently in her work environment.      The following portions of the patient's history were reviewed and updated as appropriate: allergies, current medications, past medical history, past social history and  problem list.  ROS as in subjective above.     Objective:    Physical Exam Alert and in no distress.  Foot exam is recorded and is normal.  Hemoglobin A1c is 10.1  Lab Review    Latest Ref Rng & Units 11/07/2024    1:40 PM 07/02/2024   10:15 AM 02/28/2024   11:04 AM 09/13/2023    3:33 PM 09/13/2023    3:20 PM  Diabetic Labs  HbA1c 4.0 - 5.6 % 10.1  10.5  10.2     Microalbumin mg/L    14.4    Micro/Creat Ratio     6.1    Chol 100 - 199 mg/dL     126   HDL >39 mg/dL     41   Calc LDL 0 - 99 mg/dL     69   Triglycerides 0 - 149 mg/dL     82   Creatinine 0.57 - 1.00 mg/dL     0.63       11/07/2024    1:25 PM 07/02/2024    8:36 AM 02/28/2024   10:48 AM 10/04/2023    3:45 PM 09/13/2023    2:34 PM  BP/Weight  Systolic BP 134 118 122 124 122  Diastolic BP 78 72 80 82 80  Wt. (Lbs) 197.8 192.6 195 191.2 194  BMI 31.93 kg/m2 30.17 kg/m2 30.54 kg/m2 30.17 kg/m2 30.61 kg/m2      11" /13/2025    1:30 PM 03/14/2022    2:00 PM  Foot/eye exam completion dates  Foot Form Completion Done Done    Heather Sherman  reports that she has never smoked. She has never used smokeless  tobacco. She reports that she does not drink alcohol and does not use drugs.     Assessment & Plan:    Assessment and Plan    Type 2 diabetes mellitus, poorly controlled Poor glycemic control with A1c of 10.1%. Current regimen includes Soliqua , metformin , and irbesartan . Not using Jardiance  due to side effect concerns. Not monitoring blood glucose due to lack of glucometer and financial constraints. - Increased Soliqua  to 44 units daily. - Ordered glucometer and supplies. - Discontinued Jardiance . - Ordered blood work and urine specimen. - Recommended eye  examination.  Obesity Contributing to poor diabetes control. No dietary modifications or nutritional counseling. - Recommended consultation with a nutritionist and again she is not interested  Patient's noncompliance with medical regimen Noncompliance with blood  glucose monitoring and medication adherence due to financial constraints and lack of understanding. - Educated on importance of monitoring and adherence.  Encounter for immunization Received flu shot.

## 2024-11-08 ENCOUNTER — Ambulatory Visit: Payer: Self-pay | Admitting: Family Medicine

## 2025-03-11 ENCOUNTER — Ambulatory Visit: Admitting: Family Medicine
